# Patient Record
Sex: Female | Born: 1945
Health system: Southern US, Community
[De-identification: ages and names within clinical notes are randomized; demographics above are authoritative.]

## PROBLEM LIST (undated history)

## (undated) DIAGNOSIS — Z923 Personal history of irradiation: Secondary | ICD-10-CM

## (undated) DIAGNOSIS — K219 Gastro-esophageal reflux disease without esophagitis: Secondary | ICD-10-CM

## (undated) DIAGNOSIS — L409 Psoriasis, unspecified: Secondary | ICD-10-CM

## (undated) DIAGNOSIS — Z8719 Personal history of other diseases of the digestive system: Secondary | ICD-10-CM

## (undated) DIAGNOSIS — C50919 Malignant neoplasm of unspecified site of unspecified female breast: Secondary | ICD-10-CM

## (undated) DIAGNOSIS — M1990 Unspecified osteoarthritis, unspecified site: Secondary | ICD-10-CM

## (undated) HISTORY — PX: BREAST LUMPECTOMY: SHX2

## (undated) HISTORY — PX: COLONOSCOPY: SHX174

## (undated) HISTORY — DX: Malignant neoplasm of unspecified site of unspecified female breast: C50.919

## (undated) HISTORY — PX: APPENDECTOMY: SHX54

---

## 1997-09-22 HISTORY — PX: BREAST LUMPECTOMY: SHX2

## 1997-09-22 HISTORY — PX: BREAST SURGERY: SHX581

## 1998-06-26 ENCOUNTER — Ambulatory Visit (HOSPITAL_COMMUNITY): Admission: RE | Admit: 1998-06-26 | Discharge: 1998-06-26 | Payer: Self-pay | Admitting: Obstetrics and Gynecology

## 1998-07-03 ENCOUNTER — Ambulatory Visit (HOSPITAL_COMMUNITY): Admission: RE | Admit: 1998-07-03 | Discharge: 1998-07-03 | Payer: Self-pay | Admitting: Obstetrics and Gynecology

## 1998-07-03 ENCOUNTER — Encounter: Payer: Self-pay | Admitting: Obstetrics and Gynecology

## 1998-07-13 ENCOUNTER — Ambulatory Visit (HOSPITAL_COMMUNITY): Admission: RE | Admit: 1998-07-13 | Discharge: 1998-07-13 | Payer: Self-pay | Admitting: General Surgery

## 1998-07-13 ENCOUNTER — Encounter: Payer: Self-pay | Admitting: General Surgery

## 1998-07-13 DIAGNOSIS — Z853 Personal history of malignant neoplasm of breast: Secondary | ICD-10-CM | POA: Insufficient documentation

## 1998-07-24 ENCOUNTER — Ambulatory Visit (HOSPITAL_COMMUNITY): Admission: RE | Admit: 1998-07-24 | Discharge: 1998-07-24 | Payer: Self-pay | Admitting: General Surgery

## 1998-07-24 ENCOUNTER — Encounter: Payer: Self-pay | Admitting: General Surgery

## 1998-08-07 ENCOUNTER — Encounter: Admission: RE | Admit: 1998-08-07 | Discharge: 1998-11-05 | Payer: Self-pay | Admitting: Radiation Oncology

## 1998-08-22 ENCOUNTER — Encounter: Payer: Self-pay | Admitting: Family Medicine

## 1998-08-22 ENCOUNTER — Ambulatory Visit (HOSPITAL_COMMUNITY): Admission: RE | Admit: 1998-08-22 | Discharge: 1998-08-22 | Payer: Self-pay | Admitting: Family Medicine

## 1998-09-22 HISTORY — PX: ABDOMINAL HYSTERECTOMY: SHX81

## 1998-12-25 ENCOUNTER — Ambulatory Visit (HOSPITAL_COMMUNITY): Admission: RE | Admit: 1998-12-25 | Discharge: 1998-12-25 | Payer: Self-pay | Admitting: Oncology

## 1999-06-26 ENCOUNTER — Ambulatory Visit (HOSPITAL_COMMUNITY): Admission: RE | Admit: 1999-06-26 | Discharge: 1999-06-26 | Payer: Self-pay | Admitting: General Surgery

## 1999-06-26 ENCOUNTER — Encounter: Payer: Self-pay | Admitting: General Surgery

## 1999-07-30 ENCOUNTER — Encounter: Payer: Self-pay | Admitting: Obstetrics and Gynecology

## 1999-08-01 ENCOUNTER — Inpatient Hospital Stay (HOSPITAL_COMMUNITY): Admission: RE | Admit: 1999-08-01 | Discharge: 1999-08-03 | Payer: Self-pay | Admitting: Obstetrics and Gynecology

## 1999-08-01 ENCOUNTER — Encounter (INDEPENDENT_AMBULATORY_CARE_PROVIDER_SITE_OTHER): Payer: Self-pay | Admitting: Specialist

## 1999-12-11 ENCOUNTER — Ambulatory Visit (HOSPITAL_COMMUNITY): Admission: RE | Admit: 1999-12-11 | Discharge: 1999-12-11 | Payer: Self-pay | Admitting: Family Medicine

## 1999-12-11 ENCOUNTER — Encounter: Payer: Self-pay | Admitting: Family Medicine

## 1999-12-30 ENCOUNTER — Encounter: Admission: RE | Admit: 1999-12-30 | Discharge: 1999-12-30 | Payer: Self-pay | Admitting: General Surgery

## 1999-12-30 ENCOUNTER — Encounter: Payer: Self-pay | Admitting: General Surgery

## 2000-06-30 ENCOUNTER — Encounter: Payer: Self-pay | Admitting: General Surgery

## 2000-06-30 ENCOUNTER — Encounter: Admission: RE | Admit: 2000-06-30 | Discharge: 2000-06-30 | Payer: Self-pay | Admitting: General Surgery

## 2000-12-30 ENCOUNTER — Encounter: Payer: Self-pay | Admitting: Oncology

## 2000-12-30 ENCOUNTER — Encounter: Admission: RE | Admit: 2000-12-30 | Discharge: 2000-12-30 | Payer: Self-pay | Admitting: Oncology

## 2002-01-03 ENCOUNTER — Encounter: Payer: Self-pay | Admitting: Oncology

## 2002-01-03 ENCOUNTER — Encounter: Admission: RE | Admit: 2002-01-03 | Discharge: 2002-01-03 | Payer: Self-pay | Admitting: Oncology

## 2003-01-09 ENCOUNTER — Encounter: Payer: Self-pay | Admitting: Oncology

## 2003-01-09 ENCOUNTER — Encounter: Admission: RE | Admit: 2003-01-09 | Discharge: 2003-01-09 | Payer: Self-pay | Admitting: Oncology

## 2004-01-10 ENCOUNTER — Encounter: Admission: RE | Admit: 2004-01-10 | Discharge: 2004-01-10 | Payer: Self-pay | Admitting: Oncology

## 2004-09-22 HISTORY — PX: COLON SURGERY: SHX602

## 2005-01-20 ENCOUNTER — Encounter: Admission: RE | Admit: 2005-01-20 | Discharge: 2005-01-20 | Payer: Self-pay | Admitting: General Surgery

## 2005-01-27 ENCOUNTER — Encounter: Admission: RE | Admit: 2005-01-27 | Discharge: 2005-01-27 | Payer: Self-pay | Admitting: General Surgery

## 2005-05-16 ENCOUNTER — Ambulatory Visit: Payer: Self-pay | Admitting: Internal Medicine

## 2005-05-29 ENCOUNTER — Ambulatory Visit: Payer: Self-pay | Admitting: Internal Medicine

## 2005-05-30 ENCOUNTER — Inpatient Hospital Stay (HOSPITAL_COMMUNITY): Admission: EM | Admit: 2005-05-30 | Discharge: 2005-06-04 | Payer: Self-pay | Admitting: Emergency Medicine

## 2005-05-30 ENCOUNTER — Encounter (INDEPENDENT_AMBULATORY_CARE_PROVIDER_SITE_OTHER): Payer: Self-pay | Admitting: Specialist

## 2006-02-25 ENCOUNTER — Encounter: Admission: RE | Admit: 2006-02-25 | Discharge: 2006-02-25 | Payer: Self-pay | Admitting: General Surgery

## 2007-03-01 ENCOUNTER — Encounter: Admission: RE | Admit: 2007-03-01 | Discharge: 2007-03-01 | Payer: Self-pay | Admitting: General Surgery

## 2008-03-02 ENCOUNTER — Encounter: Admission: RE | Admit: 2008-03-02 | Discharge: 2008-03-02 | Payer: Self-pay | Admitting: Surgery

## 2009-03-05 ENCOUNTER — Encounter: Admission: RE | Admit: 2009-03-05 | Discharge: 2009-03-05 | Payer: Self-pay | Admitting: Surgery

## 2010-03-07 ENCOUNTER — Encounter: Admission: RE | Admit: 2010-03-07 | Discharge: 2010-03-07 | Payer: Self-pay | Admitting: Surgery

## 2010-04-15 ENCOUNTER — Encounter: Payer: Self-pay | Admitting: Internal Medicine

## 2010-10-22 NOTE — Letter (Signed)
Summary: Colonoscopy Letter  [facility]  [address] [place], [place] [address]   Phone: [phone]  Fax: [phone]      April 15, 2010 MRN: [id number]   [facility] [address] [place], [place]  [address]   Dear Ms. Omara,   According to your medical record, it is time for you to schedule a Colonoscopy. The American Cancer Society recommends this procedure as a method to detect early colon cancer. Patients with a family history of colon cancer, or a personal history of colon polyps or inflammatory bowel disease are at increased risk.  This letter has been generated based on the recommendations made at the time of your procedure. If you feel that in your particular situation this may no longer apply, please contact our office.  Please call our office at [phone] to schedule this appointment or to update your records at your earliest convenience.  Thank you for cooperating with [name] to provide you with the very best care possible.   Sincerely,  Wilhemina Bonito. Marina Goodell, M.D.  [facility] [phone]

## 2011-01-29 ENCOUNTER — Encounter (INDEPENDENT_AMBULATORY_CARE_PROVIDER_SITE_OTHER): Payer: Self-pay | Admitting: Surgery

## 2011-02-07 ENCOUNTER — Other Ambulatory Visit: Payer: Self-pay | Admitting: Surgery

## 2011-02-07 DIAGNOSIS — Z1231 Encounter for screening mammogram for malignant neoplasm of breast: Secondary | ICD-10-CM

## 2011-02-07 NOTE — Discharge Summary (Signed)
Cynthia Travis, Cynthia Travis                 ACCOUNT NO.:  [id number]   MEDICAL RECORD NO.:  [id number]          PATIENT TYPE:  INP   LOCATION:  1621                         FACILITY:  [facility]   PHYSICIAN:  Angelia Mould. Derrell Lolling, M.D.DATE OF BIRTH:  1945-11-18   DATE OF ADMISSION:  05/30/2005  DATE OF DISCHARGE:  06/04/2005                                 DISCHARGE SUMMARY   FINAL DIAGNOSES:  1.  Perforation (multiple) of the cecum with localized peritonitis.  2.  Chronic incarcerated ventral hernia, right lower quadrant trocar site.  3.  Status post partial mastectomy for breast cancer.  4.  Status post hysterectomy.   OPERATIONS PERFORMED:  1.  Exploratory laparotomy.  2.  Right colectomy.  3.  Repair of incarcerated ventral hernia.   DATE OF SURGERY:  May 30, 2005.   HISTORY:  This is a 65 year old white female who underwent elective  screening colonoscopy on May 29, 2005 by Dr. Yancey Flemings.  That  procedure was uneventful.  She did have progressive lower abdominal pain and  right-sided abdominal pain throughout the afternoon and evening and  developed vomiting.  She returned for evaluation on May 31, 2005, where  she was found to have a tender abdomen.  An x-ray showed a significant  pneumoperitoneum.  I was asked to see her at that point.   PHYSICAL EXAMINATION:  GENERAL:  An alert, pleasant white female in mild  distress. She was admittedly in pain but friendly.  ABDOMEN:  Exam showed that she was tender throughout the abdomen but more  significant guarding and a spasticity on the right side.  Bowel sounds were  absent.   ADMISSION DATA:  White blood cell count showed 20,300.  Abdominal x-rays did  show significant pneumoperitoneum.   HOSPITAL COURSE:  On the day of admission the patient was taken to the  operating room and underwent an exploratory laparotomy.  I found that she  had four or five small perforations of the cecum, which looked to be  temporarily sealed  over but were ischemic in nature.  There was some  localized peritonitis but there was no major fecal sludge.  There was also a  loop of small bowel that was chronically incarcerated into a right lower  quadrant trocar site, which I took down and repaired.  A right colectomy of  a limited nature was required and the surgery was uneventful.   Postoperatively the patient did fairly well.  She had an ileus for a few  days as expected.  We took her NG tube out on May 31, 2005 and her  Foley catheter was removed on June 01, 2005.  She became ambulatory.  She began having stools on the evening of June 02, 2005.  We advanced  her diet thereafter and she did well.   She was discharged on June 04, 2005.  At that time she was tolerating a  solid diet.  She was having semi-formed stool and was ambulatory.  Her  abdomen was soft with appropriate tenderness.  The wound looked good with no  cellulitis.  She was  given a prescription for Vicodin for pain and Augmentin  for three days.  She was asked to return see me in the office in five days.      Angelia Mould. Derrell Lolling, M.D.  Electronically Signed     HMI/MEDQ  D:  07/14/2005  T:  07/14/2005  Job:  [id number]   cc:   Tommy Medal, M.D.   Lenoard Aden, M.D.  Fax: [phone]

## 2011-02-07 NOTE — H&P (Signed)
[name], [name]                 ACCOUNT NO.:  [id number]   MEDICAL RECORD NO.:  [id number]          PATIENT TYPE:  EMS   LOCATION:  ED                           FACILITY:  [facility]   PHYSICIAN:  Wilhemina Bonito. Marina Goodell, M.D. [facility] OF BIRTH:  [birth year]   DATE OF ADMISSION:  05/30/2005  DATE OF DISCHARGE:                                HISTORY & PHYSICAL   CHIEF COMPLAINT:  Abdominal pain following colonoscopy.   HISTORY OF PRESENT ILLNESS:  Cynthia Travis is a pleasant, generally healthy, 65-  year-old lady who underwent screening colonoscopy yesterday.  Dr. Marina Goodell did  not remove any polyps and did see some diverticulosis.  He also noted some  stretch marks in the cecum associated with some minor hemorrhage which was  trauma induced.  He felt that these stretch marks had arisen because of  adhesions of the cecal colon that put some traction and trauma on the colon.  He did raise concern with the family that this area was at a higher increase  for colonoscopy-associated perforation.  When the patient left the [facility], she was comfortable and doing well.  However, early in the  evening at home she developed acute abdominal pain predominantly in the  lower abdomen, but it was diffuse and not focal, and then eventually it has  radiated up into the right upper quadrant where it seems to be worse today.  She had a couple of episodes of nausea and vomiting.  She stuck to a liquid  diet but did have some solid food in the form of some eggs last night and a  small amount of cereal.  Generally she has been anorexic.  She is urinating  normally.  The nausea and vomiting have not recurred.  However, in talking  with the staff at [facility], they were concerned and advised  her to come over to the emergency room for abdominal films in order to rule  out intestinal perforation.  These films are back and do show some free air  in the abdomen consistent with a bowel perforation.   White count is also up  at about 20,000.  The patient has not had any fevers or chills, and she is  not febrile here in the emergency room.  She does relate having developed  some weakness and presyncopal symptoms earlier today.  She has gotten some  Demerol here in the emergency room and feels that that has made her quite  comfortable.  At its worse, the pain was 10/10 in intensity, and she  describes it as being worse than labor pains.   ALLERGIES:  None.   CURRENT MEDICATIONS:  She does not take any medications regularly.   PAST MEDICAL HISTORY:  1.  A history of breast cancer.  She had a lumpectomy in 1999 followed by      radiation and then about a 5-year course of tamoxifen, but she has not      had this for at least 1 or 2 years.  Dr. Cyndie Chime is her oncologist.  2.  Psoriasis.  3.  A history of benign growths of the uterus and ovaries, for which she is      status post total abdominal hysterectomy.  This was sometime around the      turn of the century.   SOCIAL HISTORY:  She smokes one pack per day.  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  She and her husband have one child.  She drinks maybe a glass of wine or beer once a month at the most.   FAMILY HISTORY:  A history of diverticulitis and sudden death, probably from  MI, in her father in his late 3s.  Her mother suffered from diabetes  mellitus type 2, had an MI, had CVAs, had CHF, had diabetic retinopathy.  Her mother is deceased.  A brother has had colon cancer.  A brother and a  sister have had esophageal cancer.  The sister had undergone a hiatal hernia  repair before being diagnosed with the cancer, and she subsequently  underwent an esophagectomy.   REVIEW OF SYSTEMS:  NEUROLOGIC:  She has had some dizzy weak feeling but has  not fainted today.  PULMONARY:  No cough, but when she takes a deep breath  or does have a little bit of cough, she has abdominal pain in the right  upper  quadrant.  CARDIOVASCULAR:  No extremity edema.  No history of chest  pain or angina.  She rides a stationary bike 5 miles every morning.  MUSCULOSKELETAL:  Does not have any significant arthralgias or joint  swelling.  HEMATOLOGIC:  No unusual bleeding.  No inappropriate bruising.  DERMATOLOGIC:  No worrisome skin lesions that she has noticed.  NEUROLOGIC:  No history of strokes, TIAs, blackouts or seizures.   LABORATORY:  White blood cell count is 20.3 thousand, hemoglobin 14.5,  hematocrit 41.5.  Platelets 309,000.  MCV 87.6.  Sodium 133, potassium 3.5,  chloride 100, CO2 25, BUN 9, creatinine 0.9.  Glucose is 140.   PHYSICAL EXAMINATION:  GENERAL APPEARANCE:  The patient is a pleasant,  healthy-looking white female.  She is comfortable following her shot of  Demerol.  VITAL SIGNS:  Temperature 97.1, heart rate 108, respirations 20, blood  pressure 130/74.  HEENT:  Extraocular movements are intact.  There is no pallor.  Oropharynx:  Mucosa is moist and clear.  Dentition in good repair.  NECK:  No masses, no JVD, no bruits, no thyromegaly.  CHEST:  Clear to auscultation and percussion bilaterally.  She does splint a  little bit when she takes a deep breath.  COR:  There is regular rate and rhythm.  No murmurs, rubs or gallops.  ABDOMEN:  Soft, nondistended and quiet.  There is tenderness with rebound  bilaterally in the abdomen, more so in the lower abdomen.  No guarding, no  masses, no ecchymosis.  RECTAL, GU and BREAST:  Exams were not performed.  EXTREMITIES:  No cyanosis, clubbing or edema.  NEUROLOGIC:  The patient is pleasant.  She is alert and oriented times  three.  PSYCHIATRIC:  The patient is appropriate and making jokes and quite a nice,  humorous lady.   IMPRESSION:  1.  Perforated colon following colonoscopy.  Dr. Derrell Lolling is in seeing the      patient and is planning exploratory laparotomy later today.  2.  A history of breast cancer. 3.  Active smoker.  We will plan  to use a Nicoderm patch for her.   PLAN:  1.  Exploratory laparotomy with repair of perforated viscus and possible      colostomy later today.  2.  Continue the IV fluids, Unasyn and p.r.n. analgesics that have been      started here in the emergency room.      Jennye Moccasin, P.A. [facility]    ______________________________  Wilhemina Bonito. Marina Goodell, M.D. [facility]    SG/MEDQ  D:  05/30/2005  T:  05/30/2005  Job:  [id number]

## 2011-02-07 NOTE — Op Note (Signed)
NAMEJORDON, BOURQUIN                 ACCOUNT NO.:  [id number]   MEDICAL RECORD NO.:  [id number]          PATIENT TYPE:  INP   LOCATION:  0106                         FACILITY:  [facility]   PHYSICIAN:  Angelia Mould. Derrell Lolling, M.D.DATE OF BIRTH:  03/28/1946   DATE OF PROCEDURE:  05/30/2005  DATE OF DISCHARGE:                                 OPERATIVE REPORT   PREOPERATIVE DIAGNOSIS:  Perforated viscus.   POSTOPERATIVE DIAGNOSES:  1.  Multiple perforations of the cecum with peritonitis.  2.  Incarcerated ventral hernia   OPERATION PERFORMED:  1.  Exploratory laparotomy, right colectomy.  2.  Repair incarcerated ventral hernia (right lower quadrant trocar site).   SURGEON:  Angelia Mould. Derrell Lolling, M.D.   FIRST ASSISTANT:  Gita Kudo, M.D.   OPERATIVE INDICATIONS:  This is a 65 year old white female with a past  history of a laparoscopic assisted hysterectomy in the intermediate past.  She underwent screening colonoscopy yesterday by Dr. Yancey Flemings. She  developed lower abdominal pain last night which progressively became more  severe and more right-sided. She came to the emergency room today where she  was found to have fairly significant abdominal tenderness, a white blood  cell count 20,000 and x-rays which showed a pneumoperitoneum. I was asked to  see her. She was brought to the operating room urgently.   OPERATIVE FINDINGS:  The patient had at least four areas in the  antimesenteric wall of the cecum that looked like they had been perforated  and then had temporarily sealed over. The cecum had a dusky purplish color.  There was a foul odor and some peritonitis in the area but it was not a  diffuse peritonitis. The terminal ileum was incarcerated in a hole in the  abdominal wall in the right lower quadrant. This required fairly significant  sharp scissor dissection to free this out of this area where it was  incarcerated. The abdominal wall was repaired in that area as well. The  liver and gallbladder looked fine. The transverse colon, descending colon,  sigmoid colon, rectum and small bowel looked perfectly fine. The appendix  looked normal.   OPERATIVE TECHNIQUE:  Following the induction of general endotracheal  anesthesia, a Foley catheter was inserted. The abdomen was prepped and  draped in a sterile fashion. Midline laparotomy was performed and the  abdomen was entered and explored with the findings as described above.   To mobilize the right colon, I had to first free up the terminal ileum from  the incarcerated ventral hernia in the right lower quadrant. This took about  10 minutes because it was very deeply imbedded in the hole, but I was able  to dissect this out without perforating the small bowel. I irrigated this  area out and repaired the abdominal wall defect internally with two figure-  of-eight sutures of #1 PDS.   I then mobilized the terminal ileum and right colon and hepatic flexure by  dividing the lateral peritoneal attachments. Some of these attachments were  clamped, divided and ligated with 2-0 silk ties. We identified the duodenum  which  was not injured. I did not feel like that I could primarily repair the  cecum since it looked fairly significantly diseased. There were some acute  omental adhesions to the cecum which we took down. We transected the  terminal ileum just proximal to the area where it was incarcerating the  hernia using a GIA stapling device. We transected the right colon just below  the hepatic flexure also using a GIA stapling device. Mesenteric vessels  were isolated, clamped, divided and ligated with 2-0 silk ties. The specimen  was removed.   We then irrigated the abdomen with about 4 or 5 liters of saline. We ran the  bowel from the ligament of Treitz all the way through to the terminal ileum  and then the rest of the colon as well and found no other disease process.  We checked for bleeding and found none.   An  anastomosis was created between the terminal ileum and the hepatic  flexure of the colon using a GIA stapling device. A few points on the staple  line had some bleeding and had to be suture ligated with 3-0 silk suture  ligatures. The defect in the bowel wall was closed with a TA-60 stapling  device. A few extra sutures of 3-0 silk were placed to reinforce the staple  lines to strategic points.   We then changed our instruments and gloves. We then irrigated the abdomen  out with 3 or 4 more liters of saline and the irrigation fluid was clear.  The mesenteric defect was closed with interrupted figure-of-eight sutures of  2-0 silk. We placed Tisseel all over the anastomosis to facilitate healing  of the anastomosis. Once this had dried, we let the bowel drop back into the  abdominal cavity, returned the small bowel and omentum to their appropriate  positions. The midline fascia was closed with a running suture of #1 PDS.  The skin was closed with a few skin staples and we placed Telfa wicks in  between the staples. Clean bandages were placed and the patient taken to the  recovery room in stable condition. Estimated blood loss was about 200 mL.  Complications none. Sponge, needle and instrument counts were correct.      Angelia Mould. Derrell Lolling, M.D.  Electronically Signed     HMI/MEDQ  D:  05/30/2005  T:  05/30/2005  Job:  [id number]   cc:   Wilhemina Bonito. Marina Goodell, M.D. [facility]  [address]  [place]  [place] [address]

## 2011-03-10 ENCOUNTER — Ambulatory Visit
Admission: RE | Admit: 2011-03-10 | Discharge: 2011-03-10 | Disposition: A | Discharge status: Home / Self Care | Payer: Commercial Indemnity | Source: Ambulatory Visit | Attending: Surgery | Admitting: Surgery

## 2011-03-10 DIAGNOSIS — Z1231 Encounter for screening mammogram for malignant neoplasm of breast: Secondary | ICD-10-CM

## 2011-04-18 ENCOUNTER — Ambulatory Visit (INDEPENDENT_AMBULATORY_CARE_PROVIDER_SITE_OTHER): Payer: Commercial Indemnity | Admitting: Surgery

## 2011-04-18 ENCOUNTER — Encounter (INDEPENDENT_AMBULATORY_CARE_PROVIDER_SITE_OTHER): Payer: Self-pay | Admitting: Surgery

## 2011-04-18 VITALS — BP 116/80 | HR 60

## 2011-04-18 DIAGNOSIS — R1011 Right upper quadrant pain: Secondary | ICD-10-CM

## 2011-04-18 DIAGNOSIS — Z853 Personal history of malignant neoplasm of breast: Secondary | ICD-10-CM

## 2011-04-18 LAB — CBC WITH DIFFERENTIAL/PLATELET
Basophils Relative: 1 % (ref 0–1)
Eosinophils Relative: 1 % (ref 0–5)
HCT: 42.9 % (ref 36.0–46.0)
Lymphocytes Relative: 23 % (ref 12–46)
MCH: 29.7 pg (ref 26.0–34.0)
MCV: 89.7 fL (ref 78.0–100.0)
Monocytes Absolute: 0.6 10*3/uL (ref 0.1–1.0)
Neutro Abs: 5.8 10*3/uL (ref 1.7–7.7)
Neutrophils Relative %: 69 % (ref 43–77)
RDW: 14.2 % (ref 11.5–15.5)

## 2011-04-18 LAB — COMPREHENSIVE METABOLIC PANEL
Albumin: 4.5 g/dL (ref 3.5–5.2)
Alkaline Phosphatase: 90 U/L (ref 39–117)
CO2: 24 mEq/L (ref 19–32)
Creat: 0.73 mg/dL (ref 0.50–1.10)
Potassium: 4.1 mEq/L (ref 3.5–5.3)
Sodium: 139 mEq/L (ref 135–145)

## 2011-04-18 NOTE — Progress Notes (Signed)
04/18/2011  Cynthia Travis is a 65 y.o.female who presents for routine followup of her Right breast cancer, (DCIS)diagnosed in October ,1999 and treated with Lumpectomy/radiation. She has no problems or concerns on either side.  PFSH She has had no significant changes since the last visit here.  ROS She notes that she has been having episodes of postprandial right upper quadrant abdominal pain for the last several months. This is often followed by eating a high-fat diet. He is concerned that she may have gallstones although she has never been worked up for this in the past. She is concerned because both of her sisters have had gallbladder problems requiring emergency surgeries.  The pain seems to resolve spontaneously and she has not had a good emergency room because of these episodes. There is associated with some mild nausea.  General: The patient is alert, oriented, generally healty appearing, NAD. Mood and affect are normal.  Breasts : Status post right lumpectomy. She is still a little bit tender. Otherwise normal although there is an indention from where the lumpectomy was. No dominant mass with left breast is normal. The skin nipple or areolar changes are noted. Lymphatics: She has no axillary or supraclavicular adenopathy on either side.  Extremities: Full ROM of the surgical side with no lymphedema noted..  Abdomen: The abdomen is soft and benign. There is no tenderness. There is no hepatosplenomegaly. There are no masses. There is a well-healed surgical scar in the midline from just above the umbilicus to the symphysis. This is apparently from its liver laparotomy and right colectomy done for a colonoscopic perforation of her cecum in 2006.    Data Reviewed: Mammogram was normal  Impression: 1. Status post lumpectomy for DCIS now almost 13 years out with no evidence of recurrence 2 right upper quadrant pain of uncertain significance. It sounds suggestive of biliary tract  disease  Plan: 1. I will see her back in one year for followup of her DCIS although I told her she could just be routine followups with her primary physicians. 2. Right upper quadrant pain will need evaluation. We'll schedule CBC, CMET, and gallbladder ultrasound

## 2011-04-21 ENCOUNTER — Ambulatory Visit
Admission: RE | Admit: 2011-04-21 | Discharge: 2011-04-21 | Disposition: A | Discharge status: Home / Self Care | Payer: Commercial Indemnity | Source: Ambulatory Visit | Attending: Surgery | Admitting: Surgery

## 2011-04-21 ENCOUNTER — Encounter (INDEPENDENT_AMBULATORY_CARE_PROVIDER_SITE_OTHER): Payer: Self-pay | Admitting: Surgery

## 2011-04-21 DIAGNOSIS — R1011 Right upper quadrant pain: Secondary | ICD-10-CM

## 2011-04-22 ENCOUNTER — Telehealth (INDEPENDENT_AMBULATORY_CARE_PROVIDER_SITE_OTHER): Payer: Self-pay | Admitting: General Surgery

## 2011-04-22 NOTE — Telephone Encounter (Signed)
04/22/11 pt was called and given negative results of abdominal ultra sound per dr streck/;/ and told to follow up with pcp// edie [id number] eh

## 2012-02-06 ENCOUNTER — Other Ambulatory Visit (INDEPENDENT_AMBULATORY_CARE_PROVIDER_SITE_OTHER): Payer: Self-pay | Admitting: Surgery

## 2012-02-06 DIAGNOSIS — Z1231 Encounter for screening mammogram for malignant neoplasm of breast: Secondary | ICD-10-CM

## 2012-03-12 ENCOUNTER — Ambulatory Visit
Admission: RE | Admit: 2012-03-12 | Discharge: 2012-03-12 | Disposition: A | Discharge status: Home / Self Care | Payer: Commercial Indemnity | Source: Ambulatory Visit | Attending: Surgery | Admitting: Surgery

## 2012-03-12 DIAGNOSIS — Z1231 Encounter for screening mammogram for malignant neoplasm of breast: Secondary | ICD-10-CM

## 2012-04-20 ENCOUNTER — Ambulatory Visit (INDEPENDENT_AMBULATORY_CARE_PROVIDER_SITE_OTHER): Payer: Commercial Indemnity | Admitting: Surgery

## 2012-04-20 ENCOUNTER — Encounter (INDEPENDENT_AMBULATORY_CARE_PROVIDER_SITE_OTHER): Payer: Self-pay | Admitting: Surgery

## 2012-04-20 VITALS — BP 118/62 | HR 76 | Temp 98.6°F | Resp 16 | Ht 63.5 in | Wt 162.4 lb

## 2012-04-20 DIAGNOSIS — Z853 Personal history of malignant neoplasm of breast: Secondary | ICD-10-CM

## 2012-04-20 NOTE — Progress Notes (Signed)
04/20/2012  Cynthia Travis is a 66 y.o.female who presents for routine followup of her Right breast cancer, (DCIS)diagnosed in October ,1999 and treated with Lumpectomy/radiation. She has no problems or concerns on either side.  PFSH She has had no significant changes since the last visit here.  ROS Basically negative. Still some occasional RUQ post prandial pain. Last year sono and labs negative   General: The patient is alert, oriented, generally healty appearing, NAD. Mood and affect are normal.  Breasts : Status post right lumpectomy. She is still a little bit tender. Otherwise normal although there is an indention from where the lumpectomy was. No dominant mass with left breast is normal. The skin nipple or areolar changes are noted. Lymphatics: She has no axillary or supraclavicular adenopathy on either side.  Extremities: Full ROM of the surgical side with no lymphedema noted..   Data Reviewed: Mammogram was normal  Impression: 1. Status post lumpectomy for DCIS now almost 13 years out with no evidence of recurrence 2 [place]  Plan: 1.RTC PRN 2. Counselled re smoking cessation and written material given. Advised to f/u with Dr Wynelle Link

## 2012-04-20 NOTE — Patient Instructions (Signed)
Continue to have annual mammograms. Come to see [name] again if you develop any problems. Try to quit smoking. Smoking Cessation This document explains the best ways for you to quit smoking and new treatments to help. It lists new medicines that can double or triple your chances of quitting and quitting for good. It also considers ways to avoid relapses and concerns you may have about quitting, including weight gain. NICOTINE: A POWERFUL ADDICTION If you have tried to quit smoking, you know how hard it can be. It is hard because nicotine is a very addictive drug. For some people, it can be as addictive as heroin or cocaine. Usually, people make 2 or 3 tries, or more, before finally being able to quit. Each time you try to quit, you can learn about what helps and what hurts. Quitting takes hard work and a lot of effort, but you can quit smoking. QUITTING SMOKING IS ONE OF THE MOST IMPORTANT THINGS YOU WILL EVER DO.  You will live longer, feel better, and live better.   The impact on your body of quitting smoking is felt almost immediately:   Within 20 minutes, blood pressure decreases. Pulse returns to its normal level.   After 8 hours, carbon monoxide levels in the blood return to normal. Oxygen level increases.   After 24 hours, chance of heart attack starts to decrease. Breath, hair, and body stop smelling like smoke.   After 48 hours, damaged nerve endings begin to recover. Sense of taste and smell improve.   After 72 hours, the body is virtually free of nicotine. Bronchial tubes relax and breathing becomes easier.   After 2 to 12 weeks, lungs can hold more air. Exercise becomes easier and circulation improves.   Quitting will reduce your risk of having a heart attack, stroke, cancer, or lung disease:   After 1 year, the risk of coronary heart disease is cut in half.   After 5 years, the risk of stroke falls to the same as a nonsmoker.   After 10 years, the risk of lung cancer is cut in  half and the risk of other cancers decreases significantly.   After 15 years, the risk of coronary heart disease drops, usually to the level of a nonsmoker.   If you are pregnant, quitting smoking will improve your chances of having a healthy baby.   The people you live with, especially your children, will be healthier.   You will have extra money to spend on things other than cigarettes.  FIVE KEYS TO QUITTING Studies have shown that these 5 steps will help you quit smoking and quit for good. You have the best chances of quitting if you use them together: 1. Get ready.  2. Get support and encouragement.  3. Learn new skills and behaviors.  4. Get medicine to reduce your nicotine addiction and use it correctly.  5. Be prepared for relapse or difficult situations. Be determined to continue trying to quit, even if you do not succeed at first.  1. GET READY  Set a quit date.   Change your environment.   Get rid of ALL cigarettes, ashtrays, matches, and lighters in your home, car, and place of work.   Do not let people smoke in your home.   Review your past attempts to quit. Think about what worked and what did not.   Once you quit, do not smoke. NOT EVEN A PUFF!  2. GET SUPPORT AND ENCOURAGEMENT Studies have shown that you have  a better chance of being successful if you have help. You can get support in many ways.  Tell your family, friends, and coworkers that you are going to quit and need their support. Ask them not to smoke around you.   Talk to your caregivers (doctor, dentist, nurse, pharmacist, psychologist, and/or smoking counselor).   Get individual, group, or telephone counseling and support. The more counseling you have, the better your chances are of quitting. Programs are available at [employer] and health centers. Call your local health department for information about programs in your area.   Spiritual beliefs and practices may help some smokers quit.   Quit  meters are Photographer that keep track of quit statistics, such as amount of "quit-time," cigarettes not smoked, and money saved.   Many smokers find one or more of the many self-help books available useful in helping them quit and stay off tobacco.  3. LEARN NEW SKILLS AND BEHAVIORS  Try to distract yourself from urges to smoke. Talk to someone, go for a walk, or occupy your time with a task.   When you first try to quit, change your routine. Take a different route to work. Drink tea instead of coffee. Eat breakfast in a different place.   Do something to reduce your stress. Take a hot bath, exercise, or read a book.   Plan something enjoyable to do every day. Reward yourself for not smoking.   Explore interactive web-based programs that specialize in helping you quit.  4. GET MEDICINE AND USE IT CORRECTLY Medicines can help you stop smoking and decrease the urge to smoke. Combining medicine with the above behavioral methods and support can quadruple your chances of successfully quitting smoking. The [place] Food and Drug Administration (FDA) has approved 7 medicines to help you quit smoking. These medicines fall into 3 categories.  Nicotine replacement therapy (delivers nicotine to your body without the negative effects and risks of smoking):   Nicotine gum: Available over-the-counter.   Nicotine lozenges: Available over-the-counter.   Nicotine inhaler: Available by prescription.   Nicotine nasal spray: Available by prescription.   Nicotine skin patches (transdermal): Available by prescription and over-the-counter.   Antidepressant medicine (helps people abstain from smoking, but how this works is unknown):   Bupropion sustained-release (SR) tablets: Available by prescription.   Nicotinic receptor partial agonist (simulates the effect of nicotine in your brain):   Varenicline tartrate tablets: Available by prescription.   Ask your caregiver for  advice about which medicines to use and how to use them. Carefully read the information on the package.   Everyone who is trying to quit may benefit from using a medicine. If you are pregnant or trying to become pregnant, nursing an infant, you are under age 35, or you smoke fewer than 10 cigarettes per day, talk to your caregiver before taking any nicotine replacement medicines.   You should stop using a nicotine replacement product and call your caregiver if you experience nausea, dizziness, weakness, vomiting, fast or irregular heartbeat, mouth problems with the lozenge or gum, or redness or swelling of the skin around the patch that does not go away.   Do not use any other product containing nicotine while using a nicotine replacement product.   Talk to your caregiver before using these products if you have diabetes, heart disease, asthma, stomach ulcers, you had a recent heart attack, you have high blood pressure that is not controlled with medicine, a history of irregular  heartbeat, or you have been prescribed medicine to help you quit smoking.  5. BE PREPARED FOR RELAPSE OR DIFFICULT SITUATIONS  Most relapses occur within the first 3 months after quitting. Do not be discouraged if you start smoking again. Remember, most people try several times before they finally quit.   You may have symptoms of withdrawal because your body is used to nicotine. You may crave cigarettes, be irritable, feel very hungry, cough often, get headaches, or have difficulty concentrating.   The withdrawal symptoms are only temporary. They are strongest when you first quit, but they will go away within 10 to 14 days.  Here are some difficult situations to watch for:  Alcohol. Avoid drinking alcohol. Drinking lowers your chances of successfully quitting.   Caffeine. Try to reduce the amount of caffeine you consume. It also lowers your chances of successfully quitting.   Other smokers. Being around smoking can make  you want to smoke. Avoid smokers.   Weight gain. Many smokers will gain weight when they quit, usually less than 10 pounds. Eat a healthy diet and stay active. Do not let weight gain distract you from your main goal, quitting smoking. Some medicines that help you quit smoking may also help delay weight gain. You can always lose the weight gained after you quit.   Bad mood or depression. There are a lot of ways to improve your mood other than smoking.  If you are having problems with any of these situations, talk to your caregiver. SPECIAL SITUATIONS AND CONDITIONS Studies suggest that everyone can quit smoking. Your situation or condition can give you a special reason to quit.  Pregnant women/new mothers: By quitting, you protect your baby's health and your own.   Hospitalized patients: By quitting, you reduce health problems and help healing.   Heart attack patients: By quitting, you reduce your risk of a second heart attack.   Lung, head, and neck cancer patients: By quitting, you reduce your chance of a second cancer.   Parents of children and adolescents: By quitting, you protect your children from illnesses caused by secondhand smoke.  QUESTIONS TO THINK ABOUT Think about the following questions before you try to stop smoking. You may want to talk about your answers with your caregiver.  Why do you want to quit?   If you tried to quit in the past, what helped and what did not?   What will be the most difficult situations for you after you quit? How will you plan to handle them?   Who can help you through the tough times? Your family? Friends? Caregiver?   What pleasures do you get from smoking? What ways can you still get pleasure if you quit?  Here are some questions to ask your caregiver:  How can you help me to be successful at quitting?   What medicine do you think would be best for me and how should I take it?   What should I do if I need more help?   What is smoking  withdrawal like? How can I get information on withdrawal?  Quitting takes hard work and a lot of effort, but you can quit smoking. FOR MORE INFORMATION  Smokefree.gov (http://www.davis-sullivan.com/) provides free, accurate, evidence-based information and professional assistance to help support the immediate and long-term needs of people trying to quit smoking. Document Released: 09/02/2001 Document Revised: 08/28/2011 Document Reviewed: 06/25/2009 [facility] Patient Information 2012 [place], [place].

## 2012-10-08 DIAGNOSIS — Z23 Encounter for immunization: Secondary | ICD-10-CM | POA: Diagnosis not present

## 2013-02-07 ENCOUNTER — Other Ambulatory Visit: Payer: Self-pay

## 2013-02-07 DIAGNOSIS — Z1231 Encounter for screening mammogram for malignant neoplasm of breast: Secondary | ICD-10-CM

## 2013-03-14 ENCOUNTER — Ambulatory Visit
Admission: RE | Admit: 2013-03-14 | Discharge: 2013-03-14 | Disposition: A | Discharge status: Home / Self Care | Payer: Medicare Other | Source: Ambulatory Visit

## 2013-03-14 ENCOUNTER — Ambulatory Visit: Payer: Commercial Indemnity

## 2013-03-14 DIAGNOSIS — Z1231 Encounter for screening mammogram for malignant neoplasm of breast: Secondary | ICD-10-CM | POA: Diagnosis not present

## 2013-06-17 ENCOUNTER — Ambulatory Visit
Admission: RE | Admit: 2013-06-17 | Discharge: 2013-06-17 | Disposition: A | Discharge status: Home / Self Care | Payer: Medicare Other | Source: Ambulatory Visit | Attending: Physician Assistant | Admitting: Physician Assistant

## 2013-06-17 ENCOUNTER — Other Ambulatory Visit: Payer: Self-pay | Admitting: Physician Assistant

## 2013-06-17 DIAGNOSIS — M79609 Pain in unspecified limb: Secondary | ICD-10-CM | POA: Diagnosis not present

## 2013-06-17 DIAGNOSIS — Z1331 Encounter for screening for depression: Secondary | ICD-10-CM | POA: Diagnosis not present

## 2013-06-17 DIAGNOSIS — Z23 Encounter for immunization: Secondary | ICD-10-CM | POA: Diagnosis not present

## 2013-06-17 DIAGNOSIS — M545 Low back pain, unspecified: Secondary | ICD-10-CM | POA: Diagnosis not present

## 2013-06-17 DIAGNOSIS — M25569 Pain in unspecified knee: Secondary | ICD-10-CM | POA: Diagnosis not present

## 2013-06-17 DIAGNOSIS — M169 Osteoarthritis of hip, unspecified: Secondary | ICD-10-CM | POA: Diagnosis not present

## 2013-06-29 DIAGNOSIS — M25569 Pain in unspecified knee: Secondary | ICD-10-CM | POA: Diagnosis not present

## 2013-06-29 DIAGNOSIS — M1710 Unilateral primary osteoarthritis, unspecified knee: Secondary | ICD-10-CM | POA: Diagnosis not present

## 2013-08-03 DIAGNOSIS — M169 Osteoarthritis of hip, unspecified: Secondary | ICD-10-CM | POA: Diagnosis not present

## 2013-09-21 ENCOUNTER — Other Ambulatory Visit: Payer: Self-pay | Admitting: Orthopedic Surgery

## 2013-09-28 ENCOUNTER — Encounter (HOSPITAL_COMMUNITY): Payer: Self-pay | Admitting: Pharmacy Technician

## 2013-09-30 NOTE — Pre-Procedure Instructions (Signed)
Cynthia Travis  09/30/2013   Your procedure is scheduled on:  Monday October 10, 2013 @ 7:30 AM.  Report to [facility] Short Stay Entrance "A"  Admitting at 5:30 AM.  Call this number if you have problems the morning of surgery: [phone]   Remember:   Do not eat food or drink liquids after midnight.   Take these medicines the morning of surgery with A SIP OF WATER: NONE   Do not wear jewelry, make-up or nail polish.  Do not wear lotions, powders, or perfumes. You may wear deodorant.  Do not shave 48 hours prior to surgery.   Do not bring valuables to the hospital.  [facility] is not responsible for any belongings or valuables.               Contacts, dentures or bridgework may not be worn into surgery.  Leave suitcase in the car. After surgery it may be brought to your room.  For patients admitted to the hospital, discharge time is determined by your treatment team.               Patients discharged the day of surgery will not be allowed to drive home.  Name and phone number of your driver: Family/Friend  Special Instructions: Shower using CHG 2 nights before surgery and the night before surgery.  If you shower the day of surgery use CHG.  Use special wash - you have one bottle of CHG for all showers.  You should use approximately 1/3 of the bottle for each shower.   Please read over the following fact sheets that you were given: Pain Booklet, Coughing and Deep Breathing, Blood Transfusion Information, Total Joint Packet, MRSA Information and Surgical Site Infection Prevention

## 2013-10-03 ENCOUNTER — Encounter (HOSPITAL_COMMUNITY): Payer: Self-pay

## 2013-10-03 ENCOUNTER — Encounter (HOSPITAL_COMMUNITY)
Admission: RE | Admit: 2013-10-03 | Discharge: 2013-10-03 | Disposition: A | Discharge status: Home / Self Care | Payer: Medicare Other | Source: Ambulatory Visit | Attending: Orthopedic Surgery | Admitting: Orthopedic Surgery

## 2013-10-03 ENCOUNTER — Ambulatory Visit (HOSPITAL_COMMUNITY)
Admission: RE | Admit: 2013-10-03 | Discharge: 2013-10-03 | Disposition: A | Discharge status: Home / Self Care | Payer: Medicare Other | Source: Ambulatory Visit | Attending: Orthopedic Surgery | Admitting: Orthopedic Surgery

## 2013-10-03 DIAGNOSIS — Z01812 Encounter for preprocedural laboratory examination: Secondary | ICD-10-CM | POA: Diagnosis not present

## 2013-10-03 DIAGNOSIS — I517 Cardiomegaly: Secondary | ICD-10-CM | POA: Diagnosis not present

## 2013-10-03 DIAGNOSIS — Z01818 Encounter for other preprocedural examination: Secondary | ICD-10-CM | POA: Diagnosis not present

## 2013-10-03 DIAGNOSIS — Z01810 Encounter for preprocedural cardiovascular examination: Secondary | ICD-10-CM | POA: Diagnosis not present

## 2013-10-03 HISTORY — DX: Psoriasis, unspecified: L40.9

## 2013-10-03 HISTORY — DX: Gastro-esophageal reflux disease without esophagitis: K21.9

## 2013-10-03 HISTORY — DX: Personal history of other diseases of the digestive system: Z87.19

## 2013-10-03 HISTORY — DX: Unspecified osteoarthritis, unspecified site: M19.90

## 2013-10-03 LAB — CBC WITH DIFFERENTIAL/PLATELET
Basophils Absolute: 0.1 10*3/uL (ref 0.0–0.1)
Basophils Relative: 1 % (ref 0–1)
Eosinophils Absolute: 0.2 10*3/uL (ref 0.0–0.7)
Eosinophils Relative: 2 % (ref 0–5)
HCT: 40.9 % (ref 36.0–46.0)
HEMOGLOBIN: 14 g/dL (ref 12.0–15.0)
LYMPHS ABS: 2.5 10*3/uL (ref 0.7–4.0)
Lymphocytes Relative: 26 % (ref 12–46)
MCH: 30.5 pg (ref 26.0–34.0)
MCHC: 34.2 g/dL (ref 30.0–36.0)
MCV: 89.1 fL (ref 78.0–100.0)
Monocytes Absolute: 0.6 10*3/uL (ref 0.1–1.0)
Monocytes Relative: 6 % (ref 3–12)
NEUTROS ABS: 6.4 10*3/uL (ref 1.7–7.7)
NEUTROS PCT: 65 % (ref 43–77)
Platelets: 325 10*3/uL (ref 150–400)
RBC: 4.59 MIL/uL (ref 3.87–5.11)
RDW: 13.7 % (ref 11.5–15.5)
WBC: 9.8 10*3/uL (ref 4.0–10.5)

## 2013-10-03 LAB — URINALYSIS, ROUTINE W REFLEX MICROSCOPIC
Bilirubin Urine: NEGATIVE
Glucose, UA: NEGATIVE mg/dL
Hgb urine dipstick: NEGATIVE
KETONES UR: NEGATIVE mg/dL
LEUKOCYTES UA: NEGATIVE
Nitrite: NEGATIVE
PROTEIN: NEGATIVE mg/dL
Specific Gravity, Urine: 1.005 (ref 1.005–1.030)
UROBILINOGEN UA: 0.2 mg/dL (ref 0.0–1.0)
pH: 5 (ref 5.0–8.0)

## 2013-10-03 LAB — ABO/RH: ABO/RH(D): A POS

## 2013-10-03 LAB — BASIC METABOLIC PANEL
BUN: 13 mg/dL (ref 6–23)
CHLORIDE: 101 meq/L (ref 96–112)
CO2: 25 mEq/L (ref 19–32)
Calcium: 9.1 mg/dL (ref 8.4–10.5)
Creatinine, Ser: 0.72 mg/dL (ref 0.50–1.10)
GFR calc Af Amer: >90 mL/min (ref >90)
GFR, EST NON AFRICAN AMERICAN: 88 mL/min — AB (ref >90)
GLUCOSE: 118 mg/dL — AB (ref 70–99)
POTASSIUM: 3.9 meq/L (ref 3.7–5.3)
Sodium: 139 mEq/L (ref 137–147)

## 2013-10-03 LAB — PROTIME-INR
INR: 0.92 (ref 0.00–1.49)
Prothrombin Time: 12.2 seconds (ref 11.6–15.2)

## 2013-10-03 LAB — TYPE AND SCREEN
ABO/RH(D): A POS
Antibody Screen: NEGATIVE

## 2013-10-03 LAB — APTT: aPTT: 30 seconds (ref 24–37)

## 2013-10-03 LAB — SURGICAL PCR SCREEN
MRSA, PCR: NEGATIVE
Staphylococcus aureus: NEGATIVE

## 2013-10-06 NOTE — H&P (Signed)
TOTAL HIP ADMISSION H&P  Patient is admitted for left total hip arthroplasty.  Subjective:  Chief Complaint: left hip pain  HPI: Cynthia Travis, 68 y.o. female, has a history of pain and functional disability in the left hip(s) due to arthritis and patient has failed non-surgical conservative treatments for greater than 12 weeks to include NSAID's and/or analgesics and activity modification.  Onset of symptoms was gradual starting 2 years ago with gradually worsening course since that time.The patient noted no past surgery on the left hip(s).  Patient currently rates pain in the left hip at 10 out of 10 with activity. Patient has worsening of pain with activity and weight bearing, pain that interfers with activities of daily living and pain with passive range of motion. Patient has evidence of periarticular osteophytes and joint space narrowing by imaging studies. This condition presents safety issues increasing the risk of falls.  There is no current active infection.  Patient Active Problem List   Diagnosis Date Noted  . RUQ abdominal pain 04/18/2011  . DCIS R Breast 07/13/1998   Past Medical History  Diagnosis Date  . Breast cancer   . H/O hiatal hernia   . GERD (gastroesophageal reflux disease)   . Fecal urgency     at times occured after colon surgery  . Arthritis   . Psoriasis     hands, knuckles, & belly button    Past Surgical History  Procedure Laterality Date  . Abdominal hysterectomy  2000  . Colon surgery  2006    emergency  . Breast surgery Right 1999    Partial mastectomy  . Mastectomy Right 1999    Partial  . Colonoscopy    . Appendectomy      No prescriptions prior to admission   No Known Allergies  History  Substance Use Topics  . Smoking status: Current Every Day Smoker -- 1.00 packs/day for 44 years    Types: Cigarettes  . Smokeless tobacco: Never Used  . Alcohol Use: 0.5 oz/week    1 drink(s) per week     Comment: occasionally    Family History   Problem Relation Age of Onset  . Diabetes Mother   . Heart disease Mother   . Heart disease Father   . Hypertension Father   . Stroke Father   . Diabetes Sister   . Cancer Brother     lung & colon  . Heart disease Brother   . Cancer Brother     esophageal  . Cancer Sister     esophageal     Review of Systems  Constitutional: Negative.   HENT: Negative.   Eyes: Negative.   Respiratory: Negative.   Cardiovascular: Negative.   Gastrointestinal: Negative.   Genitourinary: Negative.   Musculoskeletal: Positive for joint pain.  Skin: Negative.   Neurological: Negative.   Endo/Heme/Allergies: Negative.   Psychiatric/Behavioral: Negative.     Objective:  Physical Exam  Constitutional: She is oriented to person, place, and time. She appears well-developed and well-nourished.  HENT:  Head: Normocephalic and atraumatic.  Eyes: Pupils are equal, round, and reactive to light.  Neck: Normal range of motion. Neck supple.  Cardiovascular: Intact distal pulses.   Respiratory: Effort normal.  GI: Soft.  Musculoskeletal:  Patient's right hip has good strength good range of motion and no pain.  Patient's left hip also has good strength.  She does have reduced internal rotation to approximately 5.  She has increased pain with this motion.  No pain with  palpation of the lateral hip but mild groin pain.  She has brisk capillary refill and is neurovascularly intact distally.  Neurological: She is alert and oriented to person, place, and time.  Skin: Skin is warm and dry.  Psychiatric: She has a normal mood and affect. Her behavior is normal. Judgment and thought content normal.    Vital signs in last 24 hours:    Labs:   Estimated body mass index is 28.31 kg/(m^2) as calculated from the following:   Height as of 04/20/12: 5' 3.5" (1.613 m).   Weight as of 04/20/12: 73.664 kg (162 lb 6.4 oz).   Imaging Review X-rays: Multiple views of the left hip are once again reviewed in  office today.  No fracture dislocation subluxation or tumors identified.  She does have moderate arthritis with a drop osteophyte.  Assessment/Plan:  End stage arthritis, left hip(s)  The patient history, physical examination, clinical judgement of the provider and imaging studies are consistent with end stage degenerative joint disease of the left hip(s) and total hip arthroplasty is deemed medically necessary. The treatment options including medical management, injection therapy, arthroscopy and arthroplasty were discussed at length. The risks and benefits of total hip arthroplasty were presented and reviewed. The risks due to aseptic loosening, infection, stiffness, dislocation/subluxation,  thromboembolic complications and other imponderables were discussed.  The patient acknowledged the explanation, agreed to proceed with the plan and consent was signed. Patient is being admitted for inpatient treatment for surgery, pain control, PT, OT, prophylactic antibiotics, VTE prophylaxis, progressive ambulation and ADL's and discharge planning.The patient is planning to be discharged to skilled nursing facility

## 2013-10-09 MED ORDER — CHLORHEXIDINE GLUCONATE 4 % EX LIQD: 60.0000 mL | Freq: Once | CUTANEOUS | Status: DC

## 2013-10-09 MED ORDER — CEFAZOLIN SODIUM-DEXTROSE 2-3 GM-% IV SOLR
2.0000 g | INTRAVENOUS | Status: AC
  Administered 2013-10-10: 2 g via INTRAVENOUS
  Filled 2013-10-09: qty 50

## 2013-10-10 ENCOUNTER — Inpatient Hospital Stay (HOSPITAL_COMMUNITY): Payer: Medicare Other

## 2013-10-10 ENCOUNTER — Encounter (HOSPITAL_COMMUNITY): Payer: Medicare Other | Admitting: Anesthesiology

## 2013-10-10 ENCOUNTER — Encounter (HOSPITAL_COMMUNITY): Payer: Self-pay | Admitting: *Deleted

## 2013-10-10 ENCOUNTER — Inpatient Hospital Stay (HOSPITAL_COMMUNITY): Payer: Medicare Other | Admitting: Anesthesiology

## 2013-10-10 ENCOUNTER — Inpatient Hospital Stay (HOSPITAL_COMMUNITY)
Admission: RE | Admit: 2013-10-10 | Discharge: 2013-10-12 | DRG: 470 | Disposition: A | Discharge status: Home Health | Payer: Medicare Other | Source: Ambulatory Visit | Attending: Orthopedic Surgery | Admitting: Orthopedic Surgery

## 2013-10-10 ENCOUNTER — Encounter (HOSPITAL_COMMUNITY)
Admission: RE | Disposition: A | Discharge status: Home Health | Payer: Self-pay | Source: Ambulatory Visit | Attending: Orthopedic Surgery

## 2013-10-10 DIAGNOSIS — Z7982 Long term (current) use of aspirin: Secondary | ICD-10-CM

## 2013-10-10 DIAGNOSIS — F17200 Nicotine dependence, unspecified, uncomplicated: Secondary | ICD-10-CM | POA: Diagnosis present

## 2013-10-10 DIAGNOSIS — Z8249 Family history of ischemic heart disease and other diseases of the circulatory system: Secondary | ICD-10-CM | POA: Diagnosis not present

## 2013-10-10 DIAGNOSIS — Z833 Family history of diabetes mellitus: Secondary | ICD-10-CM | POA: Diagnosis not present

## 2013-10-10 DIAGNOSIS — Z79899 Other long term (current) drug therapy: Secondary | ICD-10-CM

## 2013-10-10 DIAGNOSIS — Z9089 Acquired absence of other organs: Secondary | ICD-10-CM | POA: Diagnosis not present

## 2013-10-10 DIAGNOSIS — Z9010 Acquired absence of unspecified breast and nipple: Secondary | ICD-10-CM | POA: Diagnosis not present

## 2013-10-10 DIAGNOSIS — K219 Gastro-esophageal reflux disease without esophagitis: Secondary | ICD-10-CM | POA: Diagnosis not present

## 2013-10-10 DIAGNOSIS — M1610 Unilateral primary osteoarthritis, unspecified hip: Principal | ICD-10-CM | POA: Diagnosis present

## 2013-10-10 DIAGNOSIS — M169 Osteoarthritis of hip, unspecified: Secondary | ICD-10-CM | POA: Diagnosis not present

## 2013-10-10 DIAGNOSIS — L408 Other psoriasis: Secondary | ICD-10-CM | POA: Diagnosis not present

## 2013-10-10 DIAGNOSIS — M25559 Pain in unspecified hip: Secondary | ICD-10-CM | POA: Diagnosis not present

## 2013-10-10 DIAGNOSIS — Z823 Family history of stroke: Secondary | ICD-10-CM

## 2013-10-10 DIAGNOSIS — Z853 Personal history of malignant neoplasm of breast: Secondary | ICD-10-CM | POA: Diagnosis not present

## 2013-10-10 DIAGNOSIS — C50919 Malignant neoplasm of unspecified site of unspecified female breast: Secondary | ICD-10-CM | POA: Diagnosis not present

## 2013-10-10 HISTORY — PX: TOTAL HIP ARTHROPLASTY: SHX124

## 2013-10-10 LAB — GLUCOSE, CAPILLARY: Glucose-Capillary: 104 mg/dL — ABNORMAL HIGH (ref 70–99)

## 2013-10-10 SURGERY — ARTHROPLASTY, HIP, TOTAL,POSTERIOR APPROACH
Anesthesia: General | Site: Hip | Laterality: Left

## 2013-10-10 MED ORDER — MENTHOL 3 MG MT LOZG: 1.0000 | LOZENGE | OROMUCOSAL | Status: DC | PRN

## 2013-10-10 MED ORDER — ALUMINUM HYDROXIDE GEL 320 MG/5ML PO SUSP
15.0000 mL | ORAL | Status: DC | PRN
  Filled 2013-10-10: qty 30

## 2013-10-10 MED ORDER — METHOCARBAMOL 500 MG PO TABS
500.0000 mg | ORAL_TABLET | Freq: Four times a day (QID) | ORAL | Status: DC | PRN
  Administered 2013-10-10 – 2013-10-11 (×3): 500 mg via ORAL
  Filled 2013-10-10 (×2): qty 1

## 2013-10-10 MED ORDER — HYDROMORPHONE HCL PF 1 MG/ML IJ SOLN
INTRAMUSCULAR | Status: AC
  Filled 2013-10-10: qty 1

## 2013-10-10 MED ORDER — ALBUMIN HUMAN 5 % IV SOLN
INTRAVENOUS | Status: DC | PRN
  Administered 2013-10-10: 09:00:00 via INTRAVENOUS

## 2013-10-10 MED ORDER — PROPOFOL 10 MG/ML IV BOLUS
INTRAVENOUS | Status: DC | PRN
  Administered 2013-10-10: 170 mg via INTRAVENOUS

## 2013-10-10 MED ORDER — GLYCOPYRROLATE 0.2 MG/ML IJ SOLN
INTRAMUSCULAR | Status: DC | PRN
  Administered 2013-10-10: 0.6 mg via INTRAVENOUS

## 2013-10-10 MED ORDER — PHENYLEPHRINE HCL 10 MG/ML IJ SOLN
INTRAMUSCULAR | Status: DC | PRN
  Administered 2013-10-10 (×3): 80 ug via INTRAVENOUS
  Administered 2013-10-10 (×2): 40 ug via INTRAVENOUS

## 2013-10-10 MED ORDER — DOCUSATE SODIUM 100 MG PO CAPS
100.0000 mg | ORAL_CAPSULE | Freq: Two times a day (BID) | ORAL | Status: DC
  Administered 2013-10-10 – 2013-10-11 (×2): 100 mg via ORAL
  Filled 2013-10-10 (×6): qty 1

## 2013-10-10 MED ORDER — ASPIRIN EC 325 MG PO TBEC: 325.0000 mg | DELAYED_RELEASE_TABLET | Freq: Two times a day (BID) | ORAL | Status: AC

## 2013-10-10 MED ORDER — OXYCODONE HCL 5 MG PO TABS
ORAL_TABLET | ORAL | Status: AC
  Filled 2013-10-10: qty 3

## 2013-10-10 MED ORDER — MIDAZOLAM HCL 5 MG/5ML IJ SOLN
INTRAMUSCULAR | Status: DC | PRN
Start: 2013-10-10 — End: 2013-10-10
  Administered 2013-10-10: 2 mg via INTRAVENOUS

## 2013-10-10 MED ORDER — DIPHENHYDRAMINE HCL 12.5 MG/5ML PO ELIX
12.5000 mg | ORAL_SOLUTION | ORAL | Status: DC | PRN
  Filled 2013-10-10: qty 10

## 2013-10-10 MED ORDER — ROCURONIUM BROMIDE 100 MG/10ML IV SOLN
INTRAVENOUS | Status: DC | PRN
  Administered 2013-10-10: 50 mg via INTRAVENOUS

## 2013-10-10 MED ORDER — FENTANYL CITRATE 0.05 MG/ML IJ SOLN
INTRAMUSCULAR | Status: DC | PRN
  Administered 2013-10-10 (×2): 50 ug via INTRAVENOUS
  Administered 2013-10-10: 100 ug via INTRAVENOUS
  Administered 2013-10-10 (×2): 50 ug via INTRAVENOUS

## 2013-10-10 MED ORDER — DEXTROSE-NACL 5-0.45 % IV SOLN: INTRAVENOUS | Status: DC

## 2013-10-10 MED ORDER — ONDANSETRON HCL 4 MG/2ML IJ SOLN
INTRAMUSCULAR | Status: DC | PRN
  Administered 2013-10-10: 4 mg via INTRAVENOUS

## 2013-10-10 MED ORDER — MIDAZOLAM HCL 2 MG/2ML IJ SOLN: 1.0000 mg | INTRAMUSCULAR | Status: DC | PRN

## 2013-10-10 MED ORDER — BISACODYL 5 MG PO TBEC: 5.0000 mg | DELAYED_RELEASE_TABLET | Freq: Every day | ORAL | Status: DC | PRN

## 2013-10-10 MED ORDER — SODIUM CHLORIDE 0.9 % IR SOLN
Status: DC | PRN
  Administered 2013-10-10: 1000 mL

## 2013-10-10 MED ORDER — HYDROMORPHONE HCL PF 1 MG/ML IJ SOLN
1.0000 mg | INTRAMUSCULAR | Status: DC | PRN
  Administered 2013-10-10: 1 mg via INTRAVENOUS
  Filled 2013-10-10: qty 1

## 2013-10-10 MED ORDER — ONDANSETRON HCL 4 MG PO TABS: 4.0000 mg | ORAL_TABLET | Freq: Four times a day (QID) | ORAL | Status: DC | PRN

## 2013-10-10 MED ORDER — HYDROMORPHONE HCL PF 1 MG/ML IJ SOLN
0.2500 mg | INTRAMUSCULAR | Status: DC | PRN
  Administered 2013-10-10 (×4): 0.5 mg via INTRAVENOUS

## 2013-10-10 MED ORDER — METHOCARBAMOL 100 MG/ML IJ SOLN: 500.0000 mg | Freq: Four times a day (QID) | INTRAVENOUS | Status: DC | PRN

## 2013-10-10 MED ORDER — BUPIVACAINE-EPINEPHRINE 0.5% -1:200000 IJ SOLN
INTRAMUSCULAR | Status: DC | PRN
  Administered 2013-10-10: 10 mL

## 2013-10-10 MED ORDER — PHENOL 1.4 % MT LIQD: 1.0000 | OROMUCOSAL | Status: DC | PRN

## 2013-10-10 MED ORDER — NEOSTIGMINE METHYLSULFATE 1 MG/ML IJ SOLN
INTRAMUSCULAR | Status: DC | PRN
  Administered 2013-10-10: 4 mg via INTRAVENOUS

## 2013-10-10 MED ORDER — LIDOCAINE HCL (CARDIAC) 20 MG/ML IV SOLN
INTRAVENOUS | Status: DC | PRN
  Administered 2013-10-10: 90 mg via INTRAVENOUS

## 2013-10-10 MED ORDER — NICOTINE 14 MG/24HR TD PT24
14.0000 mg | MEDICATED_PATCH | Freq: Every day | TRANSDERMAL | Status: DC
  Administered 2013-10-10 – 2013-10-11 (×2): 14 mg via TRANSDERMAL
  Filled 2013-10-10 (×3): qty 1

## 2013-10-10 MED ORDER — METHOCARBAMOL 500 MG PO TABS: 500.0000 mg | ORAL_TABLET | Freq: Two times a day (BID) | ORAL | Status: DC

## 2013-10-10 MED ORDER — ARTIFICIAL TEARS OP OINT
TOPICAL_OINTMENT | OPHTHALMIC | Status: DC | PRN
  Administered 2013-10-10: 1 via OPHTHALMIC

## 2013-10-10 MED ORDER — BUPIVACAINE-EPINEPHRINE (PF) 0.5% -1:200000 IJ SOLN
INTRAMUSCULAR | Status: AC
  Filled 2013-10-10: qty 10

## 2013-10-10 MED ORDER — PNEUMOCOCCAL VAC POLYVALENT 25 MCG/0.5ML IJ INJ
0.5000 mL | INJECTION | INTRAMUSCULAR | Status: AC
  Administered 2013-10-11: 0.5 mL via INTRAMUSCULAR
  Filled 2013-10-10: qty 0.5

## 2013-10-10 MED ORDER — LACTATED RINGERS IV SOLN
INTRAVENOUS | Status: DC | PRN
  Administered 2013-10-10 (×2): via INTRAVENOUS

## 2013-10-10 MED ORDER — ACETAMINOPHEN 650 MG RE SUPP: 650.0000 mg | Freq: Four times a day (QID) | RECTAL | Status: DC | PRN

## 2013-10-10 MED ORDER — METHOCARBAMOL 500 MG PO TABS
ORAL_TABLET | ORAL | Status: AC
  Filled 2013-10-10: qty 1

## 2013-10-10 MED ORDER — SENNOSIDES-DOCUSATE SODIUM 8.6-50 MG PO TABS: 1.0000 | ORAL_TABLET | Freq: Every evening | ORAL | Status: DC | PRN

## 2013-10-10 MED ORDER — TRANEXAMIC ACID 100 MG/ML IV SOLN
1000.0000 mg | INTRAVENOUS | Status: AC
  Administered 2013-10-10: 1000 mg via INTRAVENOUS
  Filled 2013-10-10 (×2): qty 10

## 2013-10-10 MED ORDER — ACETAMINOPHEN 325 MG PO TABS: 650.0000 mg | ORAL_TABLET | Freq: Four times a day (QID) | ORAL | Status: DC | PRN

## 2013-10-10 MED ORDER — MAGNESIUM CITRATE PO SOLN: 1.0000 | Freq: Once | ORAL | Status: AC | PRN

## 2013-10-10 MED ORDER — OXYCODONE HCL 5 MG/5ML PO SOLN: 5.0000 mg | Freq: Once | ORAL | Status: AC | PRN

## 2013-10-10 MED ORDER — OXYCODONE HCL 5 MG PO TABS
5.0000 mg | ORAL_TABLET | Freq: Once | ORAL | Status: AC | PRN
  Administered 2013-10-10: 5 mg via ORAL

## 2013-10-10 MED ORDER — FENTANYL CITRATE 0.05 MG/ML IJ SOLN: 50.0000 ug | Freq: Once | INTRAMUSCULAR | Status: DC

## 2013-10-10 MED ORDER — METOCLOPRAMIDE HCL 10 MG PO TABS: 5.0000 mg | ORAL_TABLET | Freq: Three times a day (TID) | ORAL | Status: DC | PRN

## 2013-10-10 MED ORDER — ZOLPIDEM TARTRATE 5 MG PO TABS: 5.0000 mg | ORAL_TABLET | Freq: Every evening | ORAL | Status: DC | PRN

## 2013-10-10 MED ORDER — ASPIRIN EC 325 MG PO TBEC
325.0000 mg | DELAYED_RELEASE_TABLET | Freq: Every day | ORAL | Status: DC
  Administered 2013-10-11 – 2013-10-12 (×2): 325 mg via ORAL
  Filled 2013-10-10 (×3): qty 1

## 2013-10-10 MED ORDER — METOCLOPRAMIDE HCL 5 MG/ML IJ SOLN: 5.0000 mg | Freq: Three times a day (TID) | INTRAMUSCULAR | Status: DC | PRN

## 2013-10-10 MED ORDER — EPHEDRINE SULFATE 50 MG/ML IJ SOLN
INTRAMUSCULAR | Status: DC | PRN
  Administered 2013-10-10 (×3): 5 mg via INTRAVENOUS

## 2013-10-10 MED ORDER — OXYCODONE HCL 5 MG PO TABS
5.0000 mg | ORAL_TABLET | ORAL | Status: DC | PRN
  Administered 2013-10-10: 10 mg via ORAL
  Administered 2013-10-11 (×2): 5 mg via ORAL
  Administered 2013-10-11 – 2013-10-12 (×2): 10 mg via ORAL
  Filled 2013-10-10 (×2): qty 2
  Filled 2013-10-10 (×2): qty 1

## 2013-10-10 MED ORDER — PROMETHAZINE HCL 25 MG/ML IJ SOLN: 6.2500 mg | INTRAMUSCULAR | Status: DC | PRN

## 2013-10-10 MED ORDER — KETOROLAC TROMETHAMINE 30 MG/ML IJ SOLN
INTRAMUSCULAR | Status: AC
  Filled 2013-10-10: qty 1

## 2013-10-10 MED ORDER — OXYCODONE-ACETAMINOPHEN 5-325 MG PO TABS: 1.0000 | ORAL_TABLET | ORAL | Status: DC | PRN

## 2013-10-10 MED ORDER — KCL IN DEXTROSE-NACL 20-5-0.45 MEQ/L-%-% IV SOLN
INTRAVENOUS | Status: DC
  Administered 2013-10-10: 125 mL/h via INTRAVENOUS
  Filled 2013-10-10 (×8): qty 1000

## 2013-10-10 MED ORDER — ONDANSETRON HCL 4 MG/2ML IJ SOLN
4.0000 mg | Freq: Four times a day (QID) | INTRAMUSCULAR | Status: DC | PRN
  Administered 2013-10-11: 4 mg via INTRAVENOUS
  Filled 2013-10-10: qty 2

## 2013-10-10 SURGICAL SUPPLY — 51 items
BLADE SAW SGTL 18X1.27X75 (BLADE) ×2 IMPLANT
BRUSH FEMORAL CANAL (MISCELLANEOUS) IMPLANT
CAPT HIP PF COP ×2 IMPLANT
CLOTH BEACON ORANGE TIMEOUT ST (SAFETY) ×2 IMPLANT
COVER BACK TABLE 24X17X13 BIG (DRAPES) IMPLANT
COVER SURGICAL LIGHT HANDLE (MISCELLANEOUS) ×4 IMPLANT
DRAPE ORTHO SPLIT 77X108 STRL (DRAPES) ×1
DRAPE PROXIMA HALF (DRAPES) ×2 IMPLANT
DRAPE SURG ORHT 6 SPLT 77X108 (DRAPES) ×1 IMPLANT
DRAPE U-SHAPE 47X51 STRL (DRAPES) ×2 IMPLANT
DRILL BIT 7/64X5 (BIT) ×2 IMPLANT
DRSG AQUACEL AG ADV 3.5X10 (GAUZE/BANDAGES/DRESSINGS) ×2 IMPLANT
DURAPREP 26ML APPLICATOR (WOUND CARE) ×2 IMPLANT
ELECT BLADE 4.0 EZ CLEAN MEGAD (MISCELLANEOUS) ×2
ELECT REM PT RETURN 9FT ADLT (ELECTROSURGICAL) ×2
ELECTRODE BLDE 4.0 EZ CLN MEGD (MISCELLANEOUS) ×1 IMPLANT
ELECTRODE REM PT RTRN 9FT ADLT (ELECTROSURGICAL) ×1 IMPLANT
GAUZE XEROFORM 1X8 LF (GAUZE/BANDAGES/DRESSINGS) IMPLANT
GLOVE BIO SURGEON STRL SZ7.5 (GLOVE) ×2 IMPLANT
GLOVE BIO SURGEON STRL SZ8.5 (GLOVE) ×4 IMPLANT
GLOVE BIOGEL PI IND STRL 8 (GLOVE) ×2 IMPLANT
GLOVE BIOGEL PI IND STRL 9 (GLOVE) ×1 IMPLANT
GLOVE BIOGEL PI INDICATOR 8 (GLOVE) ×2
GLOVE BIOGEL PI INDICATOR 9 (GLOVE) ×1
GOWN PREVENTION PLUS XLARGE (GOWN DISPOSABLE) ×2 IMPLANT
GOWN STRL NON-REIN LRG LVL3 (GOWN DISPOSABLE) ×4 IMPLANT
GOWN STRL REIN XL XLG (GOWN DISPOSABLE) ×4 IMPLANT
HANDPIECE INTERPULSE COAX TIP (DISPOSABLE)
HOOD PEEL AWAY FACE SHEILD DIS (HOOD) ×4 IMPLANT
KIT BASIN OR (CUSTOM PROCEDURE TRAY) ×2 IMPLANT
KIT ROOM TURNOVER OR (KITS) ×2 IMPLANT
MANIFOLD NEPTUNE II (INSTRUMENTS) ×2 IMPLANT
NEEDLE 22X1 1/2 (OR ONLY) (NEEDLE) ×2 IMPLANT
NS IRRIG 1000ML POUR BTL (IV SOLUTION) ×2 IMPLANT
PACK TOTAL JOINT (CUSTOM PROCEDURE TRAY) ×2 IMPLANT
PAD ARMBOARD 7.5X6 YLW CONV (MISCELLANEOUS) ×4 IMPLANT
PASSER SUT SWANSON 36MM LOOP (INSTRUMENTS) ×2 IMPLANT
PRESSURIZER FEMORAL UNIV (MISCELLANEOUS) IMPLANT
SET HNDPC FAN SPRY TIP SCT (DISPOSABLE) IMPLANT
SUT ETHIBOND 2 V 37 (SUTURE) ×2 IMPLANT
SUT ETHILON 3 0 FSL (SUTURE) ×2 IMPLANT
SUT VIC AB 0 CTB1 27 (SUTURE) ×2 IMPLANT
SUT VIC AB 1 CTX 36 (SUTURE) ×1
SUT VIC AB 1 CTX36XBRD ANBCTR (SUTURE) ×1 IMPLANT
SUT VIC AB 2-0 CTB1 (SUTURE) ×2 IMPLANT
SYR CONTROL 10ML LL (SYRINGE) ×2 IMPLANT
TOWEL OR 17X24 6PK STRL BLUE (TOWEL DISPOSABLE) ×2 IMPLANT
TOWEL OR 17X26 10 PK STRL BLUE (TOWEL DISPOSABLE) ×2 IMPLANT
TOWER CARTRIDGE SMART MIX (DISPOSABLE) IMPLANT
TRAY FOLEY CATH 14FR (SET/KITS/TRAYS/PACK) ×2 IMPLANT
WATER STERILE IRR 1000ML POUR (IV SOLUTION) ×8 IMPLANT

## 2013-10-10 NOTE — Interval H&P Note (Signed)
History and Physical Interval Note:  10/10/2013 7:16 AM  Cynthia Travis  has presented today for surgery, with the diagnosis of LEFT HIP OSTEOARTHRITIS  The various methods of treatment have been discussed with the patient and family. After consideration of risks, benefits and other options for treatment, the patient has consented to  Procedure(s): TOTAL HIP ARTHROPLASTY (Left) as a surgical intervention .  The patient's history has been reviewed, patient examined, no change in status, stable for surgery.  I have reviewed the patient's chart and labs.  Questions were answered to the patient's satisfaction.     Kerin Salen

## 2013-10-10 NOTE — Transfer of Care (Signed)
Immediate Anesthesia Transfer of Care Note  Patient: Cynthia Travis  Procedure(s) Performed: Procedure(s): TOTAL HIP ARTHROPLASTY (Left)  Patient Location: PACU  Anesthesia Type:General  Level of Consciousness: awake, alert  and oriented  Airway & Oxygen Therapy: Patient Spontanous Breathing and Patient connected to nasal cannula oxygen  Post-op Assessment: Report given to PACU RN, Post -op Vital signs reviewed and stable and Patient moving all extremities X 4  Post vital signs: Reviewed and stable  Complications: No apparent anesthesia complications

## 2013-10-10 NOTE — Op Note (Signed)
OPERATIVE REPORT    DATE OF PROCEDURE:  10/10/2013       PREOPERATIVE DIAGNOSIS:  LEFT HIP OSTEOARTHRITIS                                                          POSTOPERATIVE DIAGNOSIS:  left hip osteoarthritis                                                           PROCEDURE:  L total hip arthroplasty using a 52 mm DePuy Pinnacle  Cup, [employer], 10-degree polyethylene liner index superior  and posterior, a +0 36 mm ceramic head, a [phone] SROM stem, 20FL Sleeve   SURGEON: Amaury Kuzel J    ASSISTANT:   [employer] K[employer]  (present throughout entire procedure and necessary for timely completion of the procedure)   ANESTHESIA: General BLOOD LOSS: 400 FLUID REPLACEMENT: 1800 crystalloid DRAINS: Foley Catheter URINE OUTPUT: 657QI COMPLICATIONS: none    INDICATIONS FOR PROCEDURE: A 68 y.o. year-old With  LEFT HIP OSTEOARTHRITIS   for 2 years, x-rays show bone-on-bone arthritic changes. Despite conservative measures with observation, anti-inflammatory medicine, narcotics, use of a cane, has severe unremitting pain and can ambulate only a few blocks before resting.  Patient desires elective L total hip arthroplasty to decrease pain and increase function. The risks, benefits, and alternatives were discussed at length including but not limited to the risks of infection, bleeding, nerve injury, stiffness, blood clots, the need for revision surgery, cardiopulmonary complications, among others, and they were willing to proceed. Questions answered     PROCEDURE IN DETAIL: The patient was identified by armband,  received preoperative IV antibiotics in the holding area at [facility], taken to the operating room , appropriate anesthetic monitors  were attached and general endotracheal anesthesia induced. Foley catheter was inserted. Pt was rolled into the R lateral decubitus position and fixed there with a Stulberg Mark II pelvic clamp.  The L lower extremity was then  prepped and draped  in the usual sterile fashion from the ankle to the hemipelvis. A time-out  procedure was performed. The skin along the lateral hip and thigh  infiltrated with 10 mL of 0.5% Marcaine and epinephrine solution. We  then made a posterolateral approach to the hip. With a #10 blade, a 16 cm  incision was made through the skin and subcutaneous tissue down to the level of the  IT band. Small bleeders were identified and cauterized. The IT band was cut in  line with skin incision exposing the greater trochanter. A Cobra retractor was placed between the gluteus minimus and the superior hip joint capsule, and a spiked Cobra between the quadratus femoris and the inferior hip joint capsule. This isolated the short  external rotators and piriformis tendons. These were tagged with a #2 Ethibond  suture and cut off their insertion on the intertrochanteric crest. The posterior  capsule was then developed into an acetabular-based flap from Posterior Superior off of the acetabulum out over the femoral neck and back posterior inferior to the acetabular rim. This flap was tagged with two #2 Ethibond  sutures and retracted protecting the sciatic nerve. This exposed the arthritic femoral head and osteophytes. The hip was then flexed and internally rotated, dislocating the femoral head and a standard neck cut performed 1 fingerbreadth above the lesser trochanter.  A spiked Cobra was placed in the cotyloid notch and a Hohmann retractor was then used to lever the femur anteriorly off of the anterior pelvic column. A posterior-inferior wing retractor was placed at the junction of the acetabulum and the ischium completing the acetabular exposure.We then removed the peripheral osteophytes and labrum from the acetabulum. We then reamed the acetabulum up to 51 mm with basket reamers obtaining good coverage in all quadrants. We then irrigated with normal  saline solution and hammered into place a 52 mm pinnacle cup  in 45  degrees of abduction and about 20 degrees of anteversion. More  peripheral osteophytes removed and a trial 10-degree liner placed with the  index superior-posterior. The hip was then flexed and internally rotated exposing the  proximal femur, which was entered with the initiating reamer followed by  the axial reamers up to a 15.5 mm full depth and 50mm partial depth. We then conically reamed to 41F to the correct depth for a 42 base neck. The calcar was milled to 41FL. A trial cone and stem was inserted in the 25 degrees anteversion, with a +0 57mm trial head. Trial reduction was then performed and excellent stability was noted with at 90 of flexion with 70 of internal rotation and then full extension with maximal external rotation. The hip could not be dislocated in full extension. The knee could easily flex  to about 130 degrees. We also stretched the abductors at this point,  because of the preexisting adductor contractures. All trial components  were then removed. The acetabulum was irrigated out with normal saline  solution. A titanium Apex [facility] was then screwed into place  followed by a 10-degree polyethylene liner index superior-posterior. On  the femoral side a 20FL ZTT1 sleeve was hammered into place, followed by a [id number] SROM stem in 25 degrees of anteversion. At this point, a +0 36 mm ceramic head was  hammered on the stem. The hip was reduced. We checked our stability  one more time and found it to be excellent. The wound was once again  thoroughly irrigated out with normal saline solution pulse lavage. The  capsular flap and short external rotators were repaired back to the  intertrochanteric crest through drill holes with a #2 Ethibond suture.  The IT band was closed with running 1 Vicryl suture. The subcutaneous  tissue with 0 and 2-0 undyed Vicryl suture and the skin with running  interlocking 3-0 nylon suture. Dressing of Xeroform and Mepilex was  then  applied. The patient was then unclamped, rolled supine, awaken extubated and taken to recovery room without difficulty in stable condition.   Nicky Milhouse J 10/10/2013, 8:44 AM

## 2013-10-10 NOTE — Anesthesia Procedure Notes (Signed)
Procedure Name: Intubation Date/Time: 10/10/2013 7:30 AM Performed by: Gaylene Brooks Pre-anesthesia Checklist: Patient identified, Timeout performed, Emergency Drugs available, Suction available and Patient being monitored Patient Re-evaluated:Patient Re-evaluated prior to inductionOxygen Delivery Method: Circle system utilized Preoxygenation: Pre-oxygenation with 100% oxygen Intubation Type: IV induction Ventilation: Mask ventilation without difficulty Laryngoscope Size: Miller and 2 Grade View: Grade I Tube type: Oral Tube size: 7.0 mm Number of attempts: 1 Airway Equipment and Method: Stylet Placement Confirmation: ETT inserted through vocal cords under direct vision,  breath sounds checked- equal and bilateral,  positive ETCO2 and CO2 detector Secured at: 21 cm Tube secured with: Tape Dental Injury: Teeth and Oropharynx as per pre-operative assessment

## 2013-10-10 NOTE — Progress Notes (Signed)
UR completed 

## 2013-10-10 NOTE — Evaluation (Signed)
Physical Therapy Evaluation Patient Details Name: Cynthia Travis MRN: [id number] DOB: August 15, 1946 Today's Date: 10/10/2013 Time: [phone] (with approx 10 minutes spent on BSC, so 3 units) PT Time Calculation (min): 47 min  PT Assessment / Plan / Recommendation History of Present Illness  s/p LTHA  Clinical Impression  Pt is s/p THA resulting in the deficits listed below (see PT Problem List).  Pt will benefit from skilled PT to increase their independence and safety with mobility to allow discharge to the venue listed below.        PT Assessment  Patient needs continued PT services    Follow Up Recommendations  Home health PT    Does the patient have the potential to tolerate intense rehabilitation      Barriers to Discharge        Equipment Recommendations  Rolling walker with 5" wheels;3in1 (PT)    Recommendations for Other Services OT consult   Frequency 7X/week    Precautions / Restrictions Precautions Precautions: Posterior Hip Restrictions LLE Weight Bearing: Weight bearing as tolerated   Pertinent Vitals/Pain Did not mention much pain with mobility; just nausea and vomiting patient repositioned for comfort       Mobility  Bed Mobility Overal bed mobility: Needs Assistance Bed Mobility: Supine to Sit;Sit to Supine Supine to sit: Min assist Sit to supine: Min assist General bed mobility comments: Cues for technique Transfers Overall transfer level: Needs assistance Equipment used: Rolling walker (2 wheeled) Transfers: Sit to/from Stand Sit to Stand: Min assist General transfer comment: Cues for safety, hand placement and technique Ambulation/Gait Ambulation/Gait assistance: Min guard Ambulation Distance (Feet): 3 Feet Assistive device: Rolling walker (2 wheeled) General Gait Details: Pivot steps BSC to bed    Exercises     PT Diagnosis: Difficulty walking;Acute pain  PT Problem List: Decreased strength;Decreased range of motion;Decreased activity  tolerance;Decreased mobility;Decreased knowledge of use of DME;Decreased knowledge of precautions;Pain PT Treatment Interventions: DME instruction;Gait training;Stair training;Functional mobility training;Therapeutic activities;Therapeutic exercise;Patient/family education     PT Goals(Current goals can be found in the care plan section) Acute Rehab PT Goals Patient Stated Goal: Go ona cruise PT Goal Formulation: With patient Time For Goal Achievement: 10/17/13 Potential to Achieve Goals: Good  Visit Information  Last PT Received On: 10/10/13 Assistance Needed: +1 History of Present Illness: s/p LTHA       Prior [place] expects to be discharged to:: Private residence Living Arrangements: Spouse/significant other Available Help at Discharge: Family;Available 24 hours/day Type of Home: House Home Access: Stairs to enter [employer] of Steps: 2-3 Entrance Stairs-Rails: Right;Left Home Layout: Two level Alternate Level Stairs-Number of Steps: 7-8 Alternate Level Stairs-Rails: Right Home Equipment: None Prior Function Level of Independence: Independent Communication Communication: No difficulties    Cognition  Cognition Arousal/Alertness: Awake/alert Behavior During Therapy: WFL for tasks assessed/performed Overall Cognitive Status: Within Functional Limits for tasks assessed    Extremity/Trunk Assessment Upper Extremity Assessment Upper Extremity Assessment: Overall WFL for tasks assessed Lower Extremity Assessment Lower Extremity Assessment: LLE deficits/detail LLE Deficits / Details: Grossly decr AROM and strength postop   Balance    End of Session PT - End of Session Activity Tolerance: Patient tolerated treatment well;Other (comment) (though with nausea and vomiting) Patient left: in bed;with call bell/phone within reach;with family/visitor present Nurse Communication: Mobility status  GP     [place], [place], [place]  10/10/2013, 4:07 PM

## 2013-10-10 NOTE — Anesthesia Postprocedure Evaluation (Signed)
  Anesthesia Post-op Note  Patient: Cynthia Travis  Procedure(s) Performed: Procedure(s): TOTAL HIP ARTHROPLASTY (Left)  Patient Location: PACU  Anesthesia Type:General  Level of Consciousness: awake  Airway and Oxygen Therapy: Patient Spontanous Breathing  Post-op Pain: mild  Post-op Assessment: Post-op Vital signs reviewed, Patient's Cardiovascular Status Stable, Respiratory Function Stable, Patent Airway, No signs of Nausea or vomiting and Pain level controlled  Post-op Vital Signs: Reviewed and stable  Complications: No apparent anesthesia complications

## 2013-10-10 NOTE — Anesthesia Preprocedure Evaluation (Addendum)
Anesthesia Evaluation  Patient identified by MRN, date of birth, ID band Patient awake    Reviewed: Allergy & Precautions, H&P , NPO status , Patient's Chart, lab work & pertinent test results  Airway Mallampati: I TM Distance: >3 FB Neck ROM: Full    Dental  (+) Teeth Intact and Dental Advisory Given   Pulmonary COPDCurrent Smoker,  + rhonchi         Cardiovascular Rhythm:Regular Rate:Normal     Neuro/Psych    GI/Hepatic hiatal hernia, GERD-  ,  Endo/Other    Renal/GU      Musculoskeletal   Abdominal   Peds  Hematology   Anesthesia Other Findings H/o breast ca  Reproductive/Obstetrics                          Anesthesia Physical Anesthesia Plan  ASA: II  Anesthesia Plan: General   Post-op Pain Management:    Induction: Intravenous  Airway Management Planned: Oral ETT  Additional Equipment:   Intra-op Plan:   Post-operative Plan: Extubation in OR  Informed Consent: I have reviewed the patients History and Physical, chart, labs and discussed the procedure including the risks, benefits and alternatives for the proposed anesthesia with the patient or authorized representative who has indicated his/her understanding and acceptance.     Plan Discussed with: CRNA and Surgeon  Anesthesia Plan Comments:         Anesthesia Quick Evaluation

## 2013-10-11 LAB — BASIC METABOLIC PANEL
BUN: 6 mg/dL (ref 6–23)
CALCIUM: 8.5 mg/dL (ref 8.4–10.5)
CO2: 24 meq/L (ref 19–32)
CREATININE: 0.57 mg/dL (ref 0.50–1.10)
Chloride: 104 mEq/L (ref 96–112)
GFR calc Af Amer: >90 mL/min (ref >90)
GFR calc non Af Amer: >90 mL/min (ref >90)
GLUCOSE: 116 mg/dL — AB (ref 70–99)
Potassium: 4 mEq/L (ref 3.7–5.3)
Sodium: 139 mEq/L (ref 137–147)

## 2013-10-11 LAB — CBC
HCT: 30.7 % — ABNORMAL LOW (ref 36.0–46.0)
HEMOGLOBIN: 10.7 g/dL — AB (ref 12.0–15.0)
MCH: 30.4 pg (ref 26.0–34.0)
MCHC: 34.9 g/dL (ref 30.0–36.0)
MCV: 87.2 fL (ref 78.0–100.0)
Platelets: 257 10*3/uL (ref 150–400)
RBC: 3.52 MIL/uL — AB (ref 3.87–5.11)
RDW: 13.7 % (ref 11.5–15.5)
WBC: 10.7 10*3/uL — ABNORMAL HIGH (ref 4.0–10.5)

## 2013-10-11 NOTE — Progress Notes (Signed)
Orthopedic Tech Progress Note Patient Details:  Cynthia Travis January 18, 1946 [id number] Patient refused ohf Patient ID: Cynthia Travis, female   DOB: 06-Dec-1945, 68 y.o.   MRN: [id number]   Cynthia Travis 10/11/2013, 6:17 PM

## 2013-10-11 NOTE — Evaluation (Signed)
Occupational Therapy Evaluation Patient Details Name: Cynthia Travis MRN: [id number] DOB: Jul 17, 1946 Today's Date: 10/11/2013 Time: 6606-3016 OT Time Calculation (min): 21 min  OT Assessment / Plan / Recommendation History of present illness s/p LTHA   Clinical Impression   Pt is at set up/sup level with UB ADLs and min-mod A with LB ADLs, sup with ADL mobility. Pt's husband will be assisting her 24/7 as needed. All education completed and no further acute OT services indicated at this time    OT Assessment  Patient does not need any further OT services    Follow Up Recommendations  No OT follow up;Supervision - Intermittent    Barriers to Discharge  none    Equipment Recommendations  None recommended by OT    Recommendations for Other Services    Frequency       Precautions / Restrictions Precautions Precautions: Posterior Hip Precaution Comments: Pt able to recall 3/3 hip precautions Restrictions Weight Bearing Restrictions: Yes LLE Weight Bearing: Weight bearing as tolerated   Pertinent Vitals/Pain 3/10 L hip    ADL  Grooming: Wash/dry hands;Wash/dry face;Supervision/safety;Min guard;Set up Where Assessed - Grooming: Unsupported sitting;Supported standing Upper Body Bathing: Simulated;Supervision/safety;Set up Where Assessed - Upper Body Bathing: Unsupported sitting Lower Body Bathing: Simulated;Minimal assistance Upper Body Dressing: Performed;Supervision/safety;Set up Where Assessed - Upper Body Dressing: Unsupported sitting Lower Body Dressing: Performed;Minimal assistance Toilet Transfer: Performed;Supervision/safety Toilet Transfer Method: Sit to stand Toilet Transfer Equipment: Regular height toilet;Grab bars;Raised toilet seat with arms (or 3-in-1 over toilet) Toileting - Clothing Manipulation and Hygiene: Performed;Min guard;Supervision/safety Where Assessed - Camera operator Manipulation and Hygiene: Standing Tub/Shower Transfer: Performed Tub/Shower  Transfer Method: Therapist, art: Shower seat without back;Grab bars;Walk in shower Equipment Used: Rolling walker (3 in 1) Transfers/Ambulation Related to ADLs: cues for hand placement & body positioning before sitting ADL Comments: Pt and her husband familair with ADL A/E form pre op class    OT Diagnosis:    OT Problem List:   OT Treatment Interventions:     OT Goals(Current goals can be found in the care plan section) Acute Rehab OT Goals Patient Stated Goal: go back to traveling   Visit Information  Last OT Received On: 10/11/13 History of Present Illness: s/p LTHA       Prior [place] expects to be discharged to:: Private residence Living Arrangements: Spouse/significant other Available Help at Discharge: Family;Available 24 hours/day Type of Home: House Home Access: Stairs to enter [employer] of Steps: 2-3 Entrance Stairs-Rails: Right;Left Home Layout: Two level Alternate Level Stairs-Number of Steps: 7-8 Alternate Level Stairs-Rails: Right Home Equipment: None Prior Function Level of Independence: Independent Communication Communication: No difficulties Dominant Hand: Right         Vision/Perception Vision - History Baseline Vision: Wears glasses all the time Patient Visual Report: No change from baseline Perception Perception: Within Functional Limits   Cognition  Cognition Arousal/Alertness: Awake/alert Behavior During Therapy: WFL for tasks assessed/performed Overall Cognitive Status: Within Functional Limits for tasks assessed    Extremity/Trunk Assessment Upper Extremity Assessment Upper Extremity Assessment: Overall WFL for tasks assessed Lower Extremity Assessment Lower Extremity Assessment: Defer to PT evaluation Cervical / Trunk Assessment Cervical / Trunk Assessment: Normal     Mobility Bed Mobility General bed mobility comments: not assessed, pt up in  recliner Transfers Overall transfer level: Needs assistance Equipment used: Rolling walker (2 wheeled) Transfers: Sit to/from Stand Sit to Stand: Supervision General transfer comment: cues for hand placement &  body positioning before sitting     Exercise     Balance Balance Overall balance assessment: No apparent balance deficits (not formally assessed)   End of Session OT - End of Session Equipment Utilized During Treatment: Rolling walker Activity Tolerance: Patient tolerated treatment well Patient left: in chair;with call bell/phone within reach;with family/visitor present  GO     Britt Bottom 10/11/2013, 1:26 PM

## 2013-10-11 NOTE — Progress Notes (Signed)
Patient ID: Cynthia Travis, female   DOB: November 03, 1945, 68 y.o.   MRN: [id number] PATIENT ID: Cynthia Travis  MRN: [id number]  DOB/AGE:  [birth year] / 68 y.o.  1 Day Post-Op Procedure(s) (LRB): TOTAL HIP ARTHROPLASTY (Left)    PROGRESS NOTE Subjective: Patient is alert, oriented,1x Nausea, no Vomiting, yes passing gas, no Bowel Movement. Taking PO well. Denies SOB, Chest or Calf Pain. Using Incentive Spirometer, PAS in place. Ambulate WBAT in room Patient reports pain as 2 on 0-10 scale  .    Objective: Vital signs in last 24 hours: Filed Vitals:   10/10/13 1516 10/10/13 1600 10/10/13 2158 10/11/13 0626  BP: 121/60  133/55 135/56  Pulse: 68  89 82  Temp: 97.5 F (36.4 C)  98.7 F (37.1 C) 97.8 F (36.6 C)  TempSrc:      Resp: 16 16 18 18   Height:      Weight:      SpO2: 100% 100% 100% 100%      Intake/Output from previous day: I/O last 3 completed shifts: In: 5361 [P.O.:360; I.V.:2600; IV Piggyback:250] Out: 353 [Urine:3; Blood:350]   Intake/Output this shift:     LABORATORY DATA:  Recent Labs  10/10/13 0547 10/11/13 0507  WBC  --  10.7*  HGB  --  10.7*  HCT  --  30.7*  PLT  --  257  NA  --  139  K  --  4.0  CL  --  104  CO2  --  24  BUN  --  6  CREATININE  --  0.57  GLUCOSE  --  116*  GLUCAP 104*  --   CALCIUM  --  8.5    Examination: Neurologically intact ABD soft Neurovascular intact Sensation intact distally Intact pulses distally Dorsiflexion/Plantar flexion intact Incision: no drainage No cellulitis present Compartment soft} XR AP&Lat of hip shows well placed\fixed THA  Assessment:   1 Day Post-Op Procedure(s) (LRB): TOTAL HIP ARTHROPLASTY (Left) ADDITIONAL DIAGNOSIS:    Plan: PT/OT WBAT, THA  posterior precautions  DVT Prophylaxis: SCDx72 hrs, ASA 325 mg BID x 2 weeks  DISCHARGE PLAN: Home, probably tomorrow when she passes physical therapy  DISCHARGE NEEDS: HHPT, HHRN, CPM, Walker and 3-in-1 comode seat

## 2013-10-11 NOTE — Progress Notes (Signed)
Physical Therapy Treatment Patient Details Name: Cynthia Travis MRN: [id number] DOB: 03/22/1946 Today's Date: 10/11/2013 Time: 4098-1191 PT Time Calculation (min): 27 min  PT Assessment / Plan / Recommendation  History of Present Illness s/p LTHA   PT Comments   Pt making great progress with mobility/PT goals.   Pt states plans are possibly for her to d/c home later today.  Pt able to ambulate from room<>ortho gym & complete stair training.     Follow Up Recommendations  Home health PT     Does the patient have the potential to tolerate intense rehabilitation     Barriers to Discharge        Equipment Recommendations  Rolling walker with 5" wheels;3in1 (PT)    Recommendations for Other Services    Frequency 7X/week   Progress towards PT Goals Progress towards PT goals: Progressing toward goals  Plan Current plan remains appropriate    Precautions / Restrictions Precautions Precautions: Posterior Hip Precaution Comments: Reviewed hip precautions Restrictions Weight Bearing Restrictions: Yes LLE Weight Bearing: Weight bearing as tolerated       Mobility  Bed Mobility Overal bed mobility: Modified Independent Bed Mobility: Supine to Sit Transfers Overall transfer level: Needs assistance Equipment used: Rolling walker (2 wheeled) Transfers: Sit to/from Stand Sit to Stand: Supervision General transfer comment: cues for hand placement & body positioning before sitting Ambulation/Gait Ambulation/Gait assistance: Supervision Ambulation Distance (Feet): 200 Feet Assistive device: Rolling walker (2 wheeled) Gait Pattern/deviations: Step-through pattern;Decreased stride length General Gait Details: cues for sequencing & safe RW use.   Stairs: Yes Stairs assistance: Min assist Stair Management: No rails;Step to pattern;Backwards;Forwards;Two rails Number of Stairs: 3 (2x's) General stair comments: Backwards with walker to simulate outside steps; forwards with bil rails to  simulate inside steps.  Cues for sequencing & technique.  Pt's husband present for education.        PT Goals (current goals can now be found in the care plan section) Acute Rehab PT Goals Patient Stated Goal: Go ona cruise PT Goal Formulation: With patient Time For Goal Achievement: 10/17/13 Potential to Achieve Goals: Good  Visit Information  Last PT Received On: 10/11/13 Assistance Needed: +1 History of Present Illness: s/p LTHA    Subjective Data  Patient Stated Goal: Go ona cruise   Cognition  Cognition Arousal/Alertness: Awake/alert Behavior During Therapy: WFL for tasks assessed/performed Overall Cognitive Status: Within Functional Limits for tasks assessed    Balance     End of Session PT - End of Session Activity Tolerance: Patient tolerated treatment well Patient left: in chair;with call bell/phone within reach;with family/visitor present Nurse Communication: Mobility status   GP     Sena Hitch 10/11/2013, 9:42 AM   Sarajane Marek, PTA [phone] 10/11/2013

## 2013-10-11 NOTE — Discharge Instructions (Signed)
Home Health Physical therapy to be provided by [facility]  [phone]

## 2013-10-11 NOTE — Progress Notes (Signed)
Physical Therapy Treatment Patient Details Name: Cynthia Travis MRN: [id number] DOB: 10/03/1945 Today's Date: 10/11/2013 Time: 4098-1191 PT Time Calculation (min): 23 min  PT Assessment / Plan / Recommendation  History of Present Illness s/p LTHA   PT Comments   Pt cont's to make steady progress with mobility.  Educated pt on HEP.    Follow Up Recommendations  Home health PT     Does the patient have the potential to tolerate intense rehabilitation     Barriers to Discharge        Equipment Recommendations  Rolling walker with 5" wheels;3in1 (PT)    Recommendations for Other Services OT consult  Frequency 7X/week   Progress towards PT Goals Progress towards PT goals: Progressing toward goals  Plan Current plan remains appropriate    Precautions / Restrictions Precautions Precautions: Posterior Hip Precaution Comments: Pt able to recall 3/3 hip precautions Restrictions Weight Bearing Restrictions: Yes LLE Weight Bearing: Weight bearing as tolerated       Mobility  Bed Mobility Overal bed mobility: Modified Independent Bed Mobility: Supine to Sit;Sit to Supine Supine to sit: Modified independent (Device/Increase time) Sit to supine: Modified independent (Device/Increase time) General bed mobility comments: not assessed, pt up in recliner Transfers Overall transfer level: Modified independent Equipment used: Rolling walker (2 wheeled) Transfers: Sit to/from Stand Sit to Stand: Supervision General transfer comment: Pt demonstrated safe hand placement Ambulation/Gait Ambulation/Gait assistance: Supervision Ambulation Distance (Feet): 400 Feet Assistive device: Rolling walker (2 wheeled) Gait Pattern/deviations: Step-through pattern General Gait Details: Pt progressing with fluidity of gait pattern    Exercises Total Joint Exercises Ankle Circles/Pumps: AROM;Both;10 reps Quad Sets: AROM;Strengthening;Both;10 reps Gluteal Sets: AROM;Strengthening;Both;10 reps Heel  Slides: AROM;Strengthening;Left;10 reps Hip ABduction/ADduction: AROM;Strengthening;Left;10 reps Straight Leg Raises: AROM;Strengthening;Left;10 reps Long Arc Quad: AROM;Strengthening;Left;10 reps     PT Goals (current goals can now be found in the care plan section) Acute Rehab PT Goals Patient Stated Goal: go back to traveling  PT Goal Formulation: With patient Time For Goal Achievement: 10/17/13 Potential to Achieve Goals: Good  Visit Information  Last PT Received On: 10/11/13 Assistance Needed: +1 History of Present Illness: s/p LTHA    Subjective Data  Patient Stated Goal: go back to traveling    Cognition  Cognition Arousal/Alertness: Awake/alert Behavior During Therapy: WFL for tasks assessed/performed Overall Cognitive Status: Within Functional Limits for tasks assessed    Balance  Balance Overall balance assessment: No apparent balance deficits (not formally assessed)  End of Session PT - End of Session Activity Tolerance: Patient tolerated treatment well Patient left: in bed;with call bell/phone within reach;with family/visitor present Nurse Communication: Mobility status   GP     Sena Hitch 10/11/2013, 3:12 PM   Sarajane Marek, PTA [phone] 10/11/2013

## 2013-10-11 NOTE — Care Management Note (Signed)
CARE MANAGEMENT NOTE 10/11/2013  Patient:  Cynthia Travis, Cynthia Travis   Account Number:  [id number]  Date Initiated:  10/11/2013  Documentation initiated by:  Ricki Miller  Subjective/Objective Assessment:   68 yr old female s/p left total hip arthroplasty.     Action/Plan:   Case Manager spoke with patient concerning home health and DME needs. Patient preoperatively setup with [facility], no changes. Has family support at discharge. Patient states she doesnt need 3in1, only walker.   Anticipated DC Date:  10/12/2013   Anticipated DC Plan:  [place] [facility]  CM consult      [place]   Choice offered to / List presented to:  C-1 Patient   DME arranged  Albertha Ghee      DME agency  [place] arranged  [place] agency  [place]   Status of service:  Completed, signed off Medicare Important Message given?   (If response is "NO", the following Medicare IM given date fields will be blank) Date Medicare IM given:   Date Additional Medicare IM given:    Discharge Disposition:  [place]

## 2013-10-12 LAB — CBC
HCT: 30.1 % — ABNORMAL LOW (ref 36.0–46.0)
Hemoglobin: 10 g/dL — ABNORMAL LOW (ref 12.0–15.0)
MCH: 29.6 pg (ref 26.0–34.0)
MCHC: 33.2 g/dL (ref 30.0–36.0)
MCV: 89.1 fL (ref 78.0–100.0)
PLATELETS: 245 10*3/uL (ref 150–400)
RBC: 3.38 MIL/uL — ABNORMAL LOW (ref 3.87–5.11)
RDW: 13.9 % (ref 11.5–15.5)
WBC: 11.1 10*3/uL — ABNORMAL HIGH (ref 4.0–10.5)

## 2013-10-12 NOTE — Progress Notes (Signed)
Physical Therapy Treatment Patient Details Name: Cynthia Travis MRN: [id number] DOB: [birth year] Today's Date: 10/12/2013 Time: 3244-0102 PT Time Calculation (min): 25 min  PT Assessment / Plan / Recommendation  History of Present Illness s/p LTHA   PT Comments   Pt cont's to move well.  Able to ambulate entire unit, complete stair training, &  Perform LE there-ex without assistance.     Follow Up Recommendations  Home health PT     Does the patient have the potential to tolerate intense rehabilitation     Barriers to Discharge        Equipment Recommendations  Rolling walker with 5" wheels;3in1 (PT)    Recommendations for Other Services    Frequency 7X/week   Progress towards PT Goals Progress towards PT goals: Progressing toward goals  Plan Current plan remains appropriate    Precautions / Restrictions Precautions Precautions: Posterior Hip Precaution Comments: Pt able to recall 3/3 hip precautions Restrictions LLE Weight Bearing: Weight bearing as tolerated       Mobility  Bed Mobility Overal bed mobility: Modified Independent Bed Mobility: Supine to Sit Transfers Overall transfer level: Modified independent Equipment used: Rolling walker (2 wheeled) Transfers: Sit to/from Stand General transfer comment: cues to reinforce hand placement Ambulation/Gait Ambulation/Gait assistance: Supervision Ambulation Distance (Feet): 500 Feet Assistive device: Rolling walker (2 wheeled) Gait Pattern/deviations: Step-through pattern;Decreased stride length General Gait Details: Cont's to improve with fluidity of gait & tolerating increased WBing through LLE.   Stairs: Yes Stairs assistance: Supervision Stair Management: No rails;Two rails;Backwards;Forwards;With walker Number of Stairs: 3 (2x's) General stair comments: Pt's husband assisted with backwards technique requiring min cueing for sequencing & technique from therapist.  Supervision with forwards technique     Exercises Total Joint Exercises Ankle Circles/Pumps: AROM;Both;15 reps Quad Sets: AROM;Strengthening;Both;15 reps Gluteal Sets: AROM;Both;15 reps Heel Slides: AROM;Left;15 reps Hip ABduction/ADduction: AROM;Left;15 reps Straight Leg Raises: AROM;Left;15 reps Long Arc Quad: AROM;Left;15 reps Marching in Standing: AROM;Left;15 reps;Seated (recliner reclined to prevent hip flexion >90 degrees)     PT Goals (current goals can now be found in the care plan section) Acute Rehab PT Goals PT Goal Formulation: With patient Time For Goal Achievement: 10/17/13 Potential to Achieve Goals: Good  Visit Information  Last PT Received On: 10/12/13 Assistance Needed: +1 History of Present Illness: s/p LTHA    Subjective Data      Cognition  Cognition Arousal/Alertness: Awake/alert Behavior During Therapy: WFL for tasks assessed/performed Overall Cognitive Status: Within Functional Limits for tasks assessed    Balance     End of Session PT - End of Session Activity Tolerance: Patient tolerated treatment well Patient left: in chair;with call bell/phone within reach;with family/visitor present Nurse Communication: Mobility status   GP     Sena Hitch 10/12/2013, 2:03 PM   Sarajane Marek, PTA [phone] 10/12/2013

## 2013-10-12 NOTE — Discharge Summary (Signed)
Patient ID: Cynthia Travis MRN: [id number] DOB/AGE: 01-18-1946 68 y.o.  Admit date: 10/10/2013 Discharge date: 10/12/2013  Admission Diagnoses:  Active Problems:   Hip arthritis   Discharge Diagnoses:  Same  Past Medical History  Diagnosis Date  . Breast cancer   . H/O hiatal hernia   . GERD (gastroesophageal reflux disease)   . Fecal urgency     at times occured after colon surgery  . Arthritis   . Psoriasis     hands, knuckles, & belly button    Surgeries: Procedure(s): TOTAL HIP ARTHROPLASTY on 10/10/2013   Consultants:    Discharged Condition: Improved  Hospital Course: Cynthia Travis is an 68 y.o. female who was admitted 10/10/2013 for operative treatment of<principal problem not specified>. Patient has severe unremitting pain that affects sleep, daily activities, and work/hobbies. After pre-op clearance the patient was taken to the operating room on 10/10/2013 and underwent  Procedure(s): TOTAL HIP ARTHROPLASTY.    Patient was given perioperative antibiotics: Anti-infectives   Start     Dose/Rate Route Frequency Ordered Stop   10/10/13 0600  ceFAZolin (ANCEF) IVPB 2 g/50 mL premix     2 g 100 mL/hr over 30 Minutes Intravenous On call to O.R. 10/09/13 1318 10/10/13 0735       Patient was given sequential compression devices, early ambulation, and chemoprophylaxis to prevent DVT.  Patient benefited maximally from hospital stay and there were no complications.    Recent vital signs: Patient Vitals for the past 24 hrs:  BP Temp Temp src Pulse Resp SpO2  10/12/13 0430 120/49 mmHg 98 F (36.7 C) - 87 18 97 %  10/12/13 0000 - - - - 18 -  10/11/13 2037 129/49 mmHg 98.6 F (37 C) - 88 18 98 %  10/11/13 1600 - - - - 18 96 %  10/11/13 1514 122/45 mmHg 99.6 F (37.6 C) Oral 81 20 100 %  10/11/13 1200 - - - - 18 98 %     Recent laboratory studies:  Recent Labs  10/11/13 0507 10/12/13 0543  WBC 10.7* 11.1*  HGB 10.7* 10.0*  HCT 30.7* 30.1*  PLT 257 245  NA 139   --   K 4.0  --   CL 104  --   CO2 24  --   BUN 6  --   CREATININE 0.57  --   GLUCOSE 116*  --   CALCIUM 8.5  --      Discharge Medications:     Medication List    STOP taking these medications       meloxicam 15 MG tablet  Commonly known as:  MOBIC      TAKE these medications       aspirin EC 325 MG tablet  Take 1 tablet (325 mg total) by mouth 2 (two) times daily.     methocarbamol 500 MG tablet  Commonly known as:  ROBAXIN  Take 1 tablet (500 mg total) by mouth 2 (two) times daily with a meal.     oxyCODONE-acetaminophen 5-325 MG per tablet  Commonly known as:  ROXICET  Take 1 tablet by mouth every 4 (four) hours as needed.     VITAMIN C PO  Take 1 tablet by mouth daily.     VITAMIN D PO  Take 1 tablet by mouth daily.        Diagnostic Studies: Dg Chest 2 View  10/03/2013   CLINICAL DATA:  Preop evaluation  EXAM: CHEST  2 VIEW  COMPARISON:  None.  FINDINGS: The heart size and mediastinal contours are within normal limits. Both lungs are clear. The visualized skeletal structures are unremarkable.  IMPRESSION: No active cardiopulmonary disease.   Electronically Signed   By: Margaree Mackintosh M.D.   On: 10/03/2013 15:23   Dg Pelvis Portable  10/10/2013   CLINICAL DATA:  Postop left hip replacement  EXAM: PORTABLE PELVIS 1-2 VIEWS  COMPARISON:  Nine A 2006  FINDINGS: Left hip replacement in satisfactory position and alignment. No fracture or acute complication.  IMPRESSION: Satisfactory left hip replacement.   Electronically Signed   By: Franchot Gallo M.D.   On: 10/10/2013 10:11    Disposition:       Discharge Orders   Future Orders Complete By Expires   Call MD / Call 911  As directed    Comments:     If you experience chest pain or shortness of breath, CALL 911 and be transported to the hospital emergency room.  If you develope a fever above 101 F, pus (white drainage) or increased drainage or redness at the wound, or calf pain, call your surgeon's office.    Change dressing  As directed    Comments:     You may change your dressing on day 5, then change the dressing daily with sterile 4 x 4 inch gauze dressing and paper tape.  You may clean the incision with alcohol prior to redressing   Constipation Prevention  As directed    Comments:     Drink plenty of fluids.  Prune juice may be helpful.  You may use a stool softener, such as Colace (over the counter) 100 mg twice a day.  Use MiraLax (over the counter) for constipation as needed.   Diet - low sodium heart healthy  As directed    Discharge instructions  As directed    Comments:     Follow up in 2 weeks with Dr. Mayer Camel   Driving restrictions  As directed    Comments:     No driving for 2 weeks   Follow the hip precautions as taught in Physical Therapy  As directed    Increase activity slowly as tolerated  As directed    Patient may shower  As directed    Comments:     You may shower without a dressing once there is no drainage.  Do not wash over the wound.  If drainage remains, cover wound with plastic wrap and then shower.      Follow-up Information   Follow up with Kerin Salen, MD In 2 weeks.   Specialty:  Orthopedic Surgery   Contact information:   [place] [address] [phone]        Signed: Theodosia Quay 10/12/2013, 8:19 AM

## 2013-10-12 NOTE — Progress Notes (Signed)
PATIENT ID: Cynthia Travis  MRN: [id number]  DOB/AGE:  12-13-1945 / 68 y.o.  2 Days Post-Op Procedure(s) (LRB): TOTAL HIP ARTHROPLASTY (Left)    PROGRESS NOTE Subjective: Patient is alert, oriented,no Nausea, no Vomiting, yes passing gas, no Bowel Movement. Taking PO well. Denies SOB, Chest or Calf Pain. Using Incentive Spirometer, PAS in place. Ambulate WBAT Patient reports pain as mild  .    Objective: Vital signs in last 24 hours: Filed Vitals:   10/11/13 1600 10/11/13 2037 10/12/13 0000 10/12/13 0430  BP:  129/49  120/49  Pulse:  88  87  Temp:  98.6 F (37 C)  98 F (36.7 C)  TempSrc:      Resp: 18 18 18 18   Height:      Weight:      SpO2: 96% 98%  97%      Intake/Output from previous day: I/O last 3 completed shifts: In: 1800 [P.O.:600; I.V.:1200] Out: 4 [Urine:4]   Intake/Output this shift:     LABORATORY DATA:  Recent Labs  10/10/13 0547 10/11/13 0507 10/12/13 0543  WBC  --  10.7* 11.1*  HGB  --  10.7* 10.0*  HCT  --  30.7* 30.1*  PLT  --  257 245  NA  --  139  --   K  --  4.0  --   CL  --  104  --   CO2  --  24  --   BUN  --  6  --   CREATININE  --  0.57  --   GLUCOSE  --  116*  --   GLUCAP 104*  --   --   CALCIUM  --  8.5  --     Examination: Neurologically intact Neurovascular intact Sensation intact distally Intact pulses distally Dorsiflexion/Plantar flexion intact Incision: dressing C/D/I and no drainage No cellulitis present Compartment soft} XR AP&Lat of hip shows well placed\fixed THA  Assessment:   2 Days Post-Op Procedure(s) (LRB): TOTAL HIP ARTHROPLASTY (Left) ADDITIONAL DIAGNOSIS:   Plan: PT/OT WBAT, THA  posterior precautions  DVT Prophylaxis: SCDx72 hrs, ASA 325 mg BID x 2 weeks  DISCHARGE PLAN: Home, today after therapy  DISCHARGE NEEDS: HHPT, HHRN, Walker and 3-in-1 comode seat

## 2013-10-13 ENCOUNTER — Other Ambulatory Visit: Payer: Self-pay | Admitting: Orthopedic Surgery

## 2013-10-13 ENCOUNTER — Encounter (HOSPITAL_COMMUNITY): Payer: Self-pay | Admitting: Orthopedic Surgery

## 2013-10-13 DIAGNOSIS — Z471 Aftercare following joint replacement surgery: Secondary | ICD-10-CM | POA: Diagnosis not present

## 2013-10-13 DIAGNOSIS — R279 Unspecified lack of coordination: Secondary | ICD-10-CM | POA: Diagnosis not present

## 2013-10-13 DIAGNOSIS — Z96649 Presence of unspecified artificial hip joint: Secondary | ICD-10-CM | POA: Diagnosis not present

## 2013-10-14 DIAGNOSIS — R279 Unspecified lack of coordination: Secondary | ICD-10-CM | POA: Diagnosis not present

## 2013-10-14 DIAGNOSIS — Z471 Aftercare following joint replacement surgery: Secondary | ICD-10-CM | POA: Diagnosis not present

## 2013-10-17 DIAGNOSIS — R279 Unspecified lack of coordination: Secondary | ICD-10-CM | POA: Diagnosis not present

## 2013-10-17 DIAGNOSIS — Z471 Aftercare following joint replacement surgery: Secondary | ICD-10-CM | POA: Diagnosis not present

## 2013-10-19 DIAGNOSIS — Z471 Aftercare following joint replacement surgery: Secondary | ICD-10-CM | POA: Diagnosis not present

## 2013-10-19 DIAGNOSIS — R279 Unspecified lack of coordination: Secondary | ICD-10-CM | POA: Diagnosis not present

## 2013-10-21 DIAGNOSIS — Z471 Aftercare following joint replacement surgery: Secondary | ICD-10-CM | POA: Diagnosis not present

## 2013-10-21 DIAGNOSIS — R279 Unspecified lack of coordination: Secondary | ICD-10-CM | POA: Diagnosis not present

## 2013-10-24 DIAGNOSIS — R279 Unspecified lack of coordination: Secondary | ICD-10-CM | POA: Diagnosis not present

## 2013-10-24 DIAGNOSIS — Z471 Aftercare following joint replacement surgery: Secondary | ICD-10-CM | POA: Diagnosis not present

## 2013-10-25 DIAGNOSIS — M1610 Unilateral primary osteoarthritis, unspecified hip: Secondary | ICD-10-CM | POA: Diagnosis not present

## 2013-10-26 DIAGNOSIS — Z471 Aftercare following joint replacement surgery: Secondary | ICD-10-CM | POA: Diagnosis not present

## 2013-10-26 DIAGNOSIS — R279 Unspecified lack of coordination: Secondary | ICD-10-CM | POA: Diagnosis not present

## 2013-10-28 DIAGNOSIS — Z471 Aftercare following joint replacement surgery: Secondary | ICD-10-CM | POA: Diagnosis not present

## 2013-10-28 DIAGNOSIS — R279 Unspecified lack of coordination: Secondary | ICD-10-CM | POA: Diagnosis not present

## 2013-10-31 DIAGNOSIS — Z471 Aftercare following joint replacement surgery: Secondary | ICD-10-CM | POA: Diagnosis not present

## 2013-10-31 DIAGNOSIS — R279 Unspecified lack of coordination: Secondary | ICD-10-CM | POA: Diagnosis not present

## 2013-11-02 DIAGNOSIS — Z471 Aftercare following joint replacement surgery: Secondary | ICD-10-CM | POA: Diagnosis not present

## 2013-11-02 DIAGNOSIS — R279 Unspecified lack of coordination: Secondary | ICD-10-CM | POA: Diagnosis not present

## 2014-02-07 ENCOUNTER — Other Ambulatory Visit: Payer: Self-pay

## 2014-02-07 DIAGNOSIS — Z1231 Encounter for screening mammogram for malignant neoplasm of breast: Secondary | ICD-10-CM

## 2014-03-15 ENCOUNTER — Encounter (INDEPENDENT_AMBULATORY_CARE_PROVIDER_SITE_OTHER): Payer: Self-pay

## 2014-03-15 ENCOUNTER — Ambulatory Visit
Admission: RE | Admit: 2014-03-15 | Discharge: 2014-03-15 | Disposition: A | Discharge status: Home / Self Care | Payer: Medicare Other | Source: Ambulatory Visit

## 2014-03-15 DIAGNOSIS — Z1231 Encounter for screening mammogram for malignant neoplasm of breast: Secondary | ICD-10-CM | POA: Diagnosis not present

## 2014-04-04 DIAGNOSIS — M25559 Pain in unspecified hip: Secondary | ICD-10-CM | POA: Diagnosis not present

## 2014-06-13 DIAGNOSIS — Z23 Encounter for immunization: Secondary | ICD-10-CM | POA: Diagnosis not present

## 2014-09-17 DIAGNOSIS — J329 Chronic sinusitis, unspecified: Secondary | ICD-10-CM | POA: Diagnosis not present

## 2014-10-10 DIAGNOSIS — Z471 Aftercare following joint replacement surgery: Secondary | ICD-10-CM | POA: Diagnosis not present

## 2014-10-10 DIAGNOSIS — Z96659 Presence of unspecified artificial knee joint: Secondary | ICD-10-CM | POA: Diagnosis not present

## 2014-10-31 DIAGNOSIS — H52223 Regular astigmatism, bilateral: Secondary | ICD-10-CM | POA: Diagnosis not present

## 2014-10-31 DIAGNOSIS — H524 Presbyopia: Secondary | ICD-10-CM | POA: Diagnosis not present

## 2014-10-31 DIAGNOSIS — H5203 Hypermetropia, bilateral: Secondary | ICD-10-CM | POA: Diagnosis not present

## 2014-10-31 DIAGNOSIS — H2513 Age-related nuclear cataract, bilateral: Secondary | ICD-10-CM | POA: Diagnosis not present

## 2014-10-31 DIAGNOSIS — H40033 Anatomical narrow angle, bilateral: Secondary | ICD-10-CM | POA: Diagnosis not present

## 2014-11-16 DIAGNOSIS — H269 Unspecified cataract: Secondary | ICD-10-CM | POA: Diagnosis not present

## 2014-11-16 DIAGNOSIS — H40039 Anatomical narrow angle, unspecified eye: Secondary | ICD-10-CM | POA: Diagnosis not present

## 2014-11-16 DIAGNOSIS — H527 Unspecified disorder of refraction: Secondary | ICD-10-CM | POA: Diagnosis not present

## 2014-12-14 DIAGNOSIS — H40031 Anatomical narrow angle, right eye: Secondary | ICD-10-CM | POA: Diagnosis not present

## 2015-01-12 DIAGNOSIS — H40032 Anatomical narrow angle, left eye: Secondary | ICD-10-CM | POA: Diagnosis not present

## 2015-01-26 DIAGNOSIS — H40033 Anatomical narrow angle, bilateral: Secondary | ICD-10-CM | POA: Diagnosis not present

## 2015-02-26 ENCOUNTER — Other Ambulatory Visit: Payer: Self-pay

## 2015-02-26 DIAGNOSIS — Z1231 Encounter for screening mammogram for malignant neoplasm of breast: Secondary | ICD-10-CM

## 2015-04-03 ENCOUNTER — Ambulatory Visit
Admission: RE | Admit: 2015-04-03 | Discharge: 2015-04-03 | Disposition: A | Discharge status: Home / Self Care | Payer: Medicare Other | Source: Ambulatory Visit

## 2015-04-03 DIAGNOSIS — Z1231 Encounter for screening mammogram for malignant neoplasm of breast: Secondary | ICD-10-CM

## 2015-04-10 DIAGNOSIS — Z01419 Encounter for gynecological examination (general) (routine) without abnormal findings: Secondary | ICD-10-CM | POA: Diagnosis not present

## 2015-04-10 DIAGNOSIS — Z124 Encounter for screening for malignant neoplasm of cervix: Secondary | ICD-10-CM | POA: Diagnosis not present

## 2015-04-18 DIAGNOSIS — Z13220 Encounter for screening for lipoid disorders: Secondary | ICD-10-CM | POA: Diagnosis not present

## 2015-04-18 DIAGNOSIS — Z1321 Encounter for screening for nutritional disorder: Secondary | ICD-10-CM | POA: Diagnosis not present

## 2015-04-18 DIAGNOSIS — Z0000 Encounter for general adult medical examination without abnormal findings: Secondary | ICD-10-CM | POA: Diagnosis not present

## 2015-04-19 ENCOUNTER — Other Ambulatory Visit: Payer: Self-pay | Admitting: Obstetrics and Gynecology

## 2015-04-19 DIAGNOSIS — M8580 Other specified disorders of bone density and structure, unspecified site: Secondary | ICD-10-CM

## 2015-04-27 ENCOUNTER — Ambulatory Visit
Admission: RE | Admit: 2015-04-27 | Discharge: 2015-04-27 | Disposition: A | Discharge status: Home / Self Care | Payer: Medicare Other | Source: Ambulatory Visit | Attending: Obstetrics and Gynecology | Admitting: Obstetrics and Gynecology

## 2015-04-27 DIAGNOSIS — M810 Age-related osteoporosis without current pathological fracture: Secondary | ICD-10-CM | POA: Diagnosis not present

## 2015-04-27 DIAGNOSIS — M8580 Other specified disorders of bone density and structure, unspecified site: Secondary | ICD-10-CM

## 2015-06-28 DIAGNOSIS — Z23 Encounter for immunization: Secondary | ICD-10-CM | POA: Diagnosis not present

## 2015-07-30 DIAGNOSIS — J329 Chronic sinusitis, unspecified: Secondary | ICD-10-CM | POA: Diagnosis not present

## 2015-09-22 DIAGNOSIS — J069 Acute upper respiratory infection, unspecified: Secondary | ICD-10-CM | POA: Diagnosis not present

## 2015-09-25 DIAGNOSIS — M940 Chondrocostal junction syndrome [Tietze]: Secondary | ICD-10-CM | POA: Diagnosis not present

## 2015-09-25 DIAGNOSIS — M545 Low back pain: Secondary | ICD-10-CM | POA: Diagnosis not present

## 2016-03-04 ENCOUNTER — Other Ambulatory Visit: Payer: Self-pay | Admitting: Obstetrics and Gynecology

## 2016-03-04 DIAGNOSIS — Z1231 Encounter for screening mammogram for malignant neoplasm of breast: Secondary | ICD-10-CM

## 2016-03-04 DIAGNOSIS — Z853 Personal history of malignant neoplasm of breast: Secondary | ICD-10-CM

## 2016-04-03 ENCOUNTER — Ambulatory Visit
Admission: RE | Admit: 2016-04-03 | Discharge: 2016-04-03 | Disposition: A | Discharge status: Home / Self Care | Payer: Medicare Other | Source: Ambulatory Visit | Attending: Obstetrics and Gynecology | Admitting: Obstetrics and Gynecology

## 2016-04-03 DIAGNOSIS — Z1231 Encounter for screening mammogram for malignant neoplasm of breast: Secondary | ICD-10-CM

## 2016-04-03 DIAGNOSIS — Z853 Personal history of malignant neoplasm of breast: Secondary | ICD-10-CM

## 2016-05-01 DIAGNOSIS — L821 Other seborrheic keratosis: Secondary | ICD-10-CM | POA: Diagnosis not present

## 2016-05-01 DIAGNOSIS — L989 Disorder of the skin and subcutaneous tissue, unspecified: Secondary | ICD-10-CM | POA: Diagnosis not present

## 2016-05-01 DIAGNOSIS — B070 Plantar wart: Secondary | ICD-10-CM | POA: Diagnosis not present

## 2016-05-09 DIAGNOSIS — L820 Inflamed seborrheic keratosis: Secondary | ICD-10-CM | POA: Diagnosis not present

## 2016-05-09 DIAGNOSIS — B078 Other viral warts: Secondary | ICD-10-CM | POA: Diagnosis not present

## 2016-05-09 DIAGNOSIS — Z1283 Encounter for screening for malignant neoplasm of skin: Secondary | ICD-10-CM | POA: Diagnosis not present

## 2016-05-09 DIAGNOSIS — B070 Plantar wart: Secondary | ICD-10-CM | POA: Diagnosis not present

## 2016-05-15 DIAGNOSIS — H524 Presbyopia: Secondary | ICD-10-CM | POA: Diagnosis not present

## 2016-05-15 DIAGNOSIS — H40013 Open angle with borderline findings, low risk, bilateral: Secondary | ICD-10-CM | POA: Diagnosis not present

## 2016-05-15 DIAGNOSIS — H25813 Combined forms of age-related cataract, bilateral: Secondary | ICD-10-CM | POA: Diagnosis not present

## 2016-05-29 ENCOUNTER — Other Ambulatory Visit: Payer: Self-pay

## 2016-06-02 DIAGNOSIS — Z23 Encounter for immunization: Secondary | ICD-10-CM | POA: Diagnosis not present

## 2016-06-24 DIAGNOSIS — M25562 Pain in left knee: Secondary | ICD-10-CM | POA: Diagnosis not present

## 2016-06-24 DIAGNOSIS — Z09 Encounter for follow-up examination after completed treatment for conditions other than malignant neoplasm: Secondary | ICD-10-CM | POA: Diagnosis not present

## 2016-06-24 DIAGNOSIS — Z96642 Presence of left artificial hip joint: Secondary | ICD-10-CM | POA: Diagnosis not present

## 2016-06-25 DIAGNOSIS — M79605 Pain in left leg: Secondary | ICD-10-CM | POA: Diagnosis not present

## 2016-06-26 DIAGNOSIS — H524 Presbyopia: Secondary | ICD-10-CM | POA: Diagnosis not present

## 2016-06-26 DIAGNOSIS — H25813 Combined forms of age-related cataract, bilateral: Secondary | ICD-10-CM | POA: Diagnosis not present

## 2016-06-26 DIAGNOSIS — H40013 Open angle with borderline findings, low risk, bilateral: Secondary | ICD-10-CM | POA: Diagnosis not present

## 2016-06-27 DIAGNOSIS — M79605 Pain in left leg: Secondary | ICD-10-CM | POA: Diagnosis not present

## 2016-06-30 DIAGNOSIS — M79605 Pain in left leg: Secondary | ICD-10-CM | POA: Diagnosis not present

## 2016-07-03 DIAGNOSIS — M79605 Pain in left leg: Secondary | ICD-10-CM | POA: Diagnosis not present

## 2016-07-08 DIAGNOSIS — M79605 Pain in left leg: Secondary | ICD-10-CM | POA: Diagnosis not present

## 2016-07-10 DIAGNOSIS — M79605 Pain in left leg: Secondary | ICD-10-CM | POA: Diagnosis not present

## 2016-07-15 DIAGNOSIS — M79605 Pain in left leg: Secondary | ICD-10-CM | POA: Diagnosis not present

## 2016-07-16 DIAGNOSIS — M79605 Pain in left leg: Secondary | ICD-10-CM | POA: Diagnosis not present

## 2016-07-21 DIAGNOSIS — M79605 Pain in left leg: Secondary | ICD-10-CM | POA: Diagnosis not present

## 2016-07-23 DIAGNOSIS — M79605 Pain in left leg: Secondary | ICD-10-CM | POA: Diagnosis not present

## 2016-08-08 DIAGNOSIS — J0100 Acute maxillary sinusitis, unspecified: Secondary | ICD-10-CM | POA: Diagnosis not present

## 2016-11-04 DIAGNOSIS — Z1389 Encounter for screening for other disorder: Secondary | ICD-10-CM | POA: Diagnosis not present

## 2016-11-04 DIAGNOSIS — Z1211 Encounter for screening for malignant neoplasm of colon: Secondary | ICD-10-CM | POA: Diagnosis not present

## 2016-11-04 DIAGNOSIS — Z87898 Personal history of other specified conditions: Secondary | ICD-10-CM | POA: Diagnosis not present

## 2016-11-04 DIAGNOSIS — M79604 Pain in right leg: Secondary | ICD-10-CM | POA: Diagnosis not present

## 2016-11-04 DIAGNOSIS — Z131 Encounter for screening for diabetes mellitus: Secondary | ICD-10-CM | POA: Diagnosis not present

## 2016-11-04 DIAGNOSIS — Z136 Encounter for screening for cardiovascular disorders: Secondary | ICD-10-CM | POA: Diagnosis not present

## 2016-11-04 DIAGNOSIS — Z0000 Encounter for general adult medical examination without abnormal findings: Secondary | ICD-10-CM | POA: Diagnosis not present

## 2016-11-11 ENCOUNTER — Other Ambulatory Visit: Payer: Self-pay | Admitting: *Deleted

## 2016-11-11 DIAGNOSIS — M79604 Pain in right leg: Secondary | ICD-10-CM

## 2016-11-13 DIAGNOSIS — Z1211 Encounter for screening for malignant neoplasm of colon: Secondary | ICD-10-CM | POA: Diagnosis not present

## 2016-12-17 ENCOUNTER — Encounter: Payer: Self-pay | Admitting: Vascular Surgery

## 2016-12-25 DIAGNOSIS — H40013 Open angle with borderline findings, low risk, bilateral: Secondary | ICD-10-CM | POA: Diagnosis not present

## 2016-12-25 DIAGNOSIS — H524 Presbyopia: Secondary | ICD-10-CM | POA: Diagnosis not present

## 2016-12-25 DIAGNOSIS — H25813 Combined forms of age-related cataract, bilateral: Secondary | ICD-10-CM | POA: Diagnosis not present

## 2016-12-30 ENCOUNTER — Ambulatory Visit (INDEPENDENT_AMBULATORY_CARE_PROVIDER_SITE_OTHER): Payer: Medicare Other | Admitting: Vascular Surgery

## 2016-12-30 ENCOUNTER — Encounter: Payer: Self-pay | Admitting: Vascular Surgery

## 2016-12-30 ENCOUNTER — Ambulatory Visit (HOSPITAL_COMMUNITY)
Admission: RE | Admit: 2016-12-30 | Discharge: 2016-12-30 | Disposition: A | Discharge status: Home / Self Care | Payer: Medicare Other | Source: Ambulatory Visit | Attending: Vascular Surgery | Admitting: Vascular Surgery

## 2016-12-30 VITALS — BP 133/71 | HR 69 | Temp 98.2°F | Resp 16 | Ht 63.5 in | Wt 160.5 lb

## 2016-12-30 DIAGNOSIS — M79604 Pain in right leg: Secondary | ICD-10-CM

## 2016-12-30 DIAGNOSIS — M79605 Pain in left leg: Secondary | ICD-10-CM

## 2016-12-30 NOTE — Progress Notes (Signed)
[facility]  Patient name: Cynthia Travis MRN: [id number] DOB: Mar 08, 1946 Sex: female  REASON FOR CONSULT: Burning and pain in bilateral lower extremities calf and feet  HPI: Cynthia Travis is a 71 y.o. female, who is seen today for evaluation of her lower extremity discomfort. She is not otherwise healthy 71 year old who reports burning and painful sensation in her calves extending into her feet. This occurs when she stands. If she sits and rests the discomfort resolves. She does not have any exercise discomfort. She can walk with no pain. She also reports if she sits for a period of time that she begins to have tingling in her calves and feet as if they "went to sleep". She has no history of lower extremity tissue loss. She does have a history of prior left hip replacement approximately 3 years ago. She does smoke cigarettes. Has no history of cardiac disease.  Past Medical History:  Diagnosis Date  . Arthritis   . Breast cancer ([place])   . Fecal urgency    at times occured after colon surgery  . GERD (gastroesophageal reflux disease)   . H/O hiatal hernia   . Psoriasis    hands, knuckles, & belly button    Family History  Problem Relation Age of Onset  . Diabetes Mother   . Heart disease Mother   . Heart disease Father   . Hypertension Father   . Stroke Father   . Diabetes Sister   . Cancer Brother     lung & colon  . Heart disease Brother   . Cancer Brother     esophageal  . Cancer Sister     esophageal    SOCIAL HISTORY: Social History   Social History  . Marital status: Married    Spouse name: N/A  . Number of children: N/A  . Years of education: N/A   Occupational History  . Not on file.   Social History Main Topics  . Smoking status: Current Every Day Smoker    Packs/day: 1.00    Years: 44.00    Types: Cigarettes  . Smokeless tobacco: Never Used  . Alcohol use 0.5 oz/week    1 Standard  drinks or equivalent per week     Comment: occasionally  . Drug use: No  . Sexual activity: Not on file   Other Topics Concern  . Not on file   Social History Narrative  . No narrative on file    No Known Allergies  Current Outpatient Prescriptions  Medication Sig Dispense Refill  . Ascorbic Acid (VITAMIN C PO) Take 1 tablet by mouth daily.    Marland Kitchen aspirin EC 325 MG tablet Take 1 tablet (325 mg total) by mouth 2 (two) times daily. 30 tablet 0  . Cholecalciferol (VITAMIN D PO) Take 1 tablet by mouth daily.    . methocarbamol (ROBAXIN) 500 MG tablet Take 1 tablet (500 mg total) by mouth 2 (two) times daily with a meal. 60 tablet 0  . oxyCODONE-acetaminophen (ROXICET) 5-325 MG per tablet Take 1 tablet by mouth every 4 (four) hours as needed. 60 tablet 0   No current facility-administered medications for this visit.     REVIEW OF SYSTEMS:  [X]  denotes positive finding, [ ]  denotes negative finding Cardiac  Comments:  Chest pain or chest pressure:    Shortness of breath upon exertion:    Short of breath when lying flat:    Irregular heart rhythm:  Vascular    Pain in calf, thigh, or hip brought on by ambulation:    Pain in feet at night that wakes you up from your sleep:     Blood clot in your veins:    Leg swelling:         Pulmonary    Oxygen at home:    Productive cough:     Wheezing:         Neurologic    Sudden weakness in arms or legs:     Sudden numbness in arms or legs:     Sudden onset of difficulty speaking or slurred speech:    Temporary loss of vision in one eye:     Problems with dizziness:         Gastrointestinal    Blood in stool:     Vomited blood:         Genitourinary    Burning when urinating:     Blood in urine:        Psychiatric    Major depression:         Hematologic    Bleeding problems:    Problems with blood clotting too easily:        Skin    Rashes or ulcers:        Constitutional    Fever or chills:      PHYSICAL  EXAM: Vitals:   12/30/16 0852  BP: 133/71  Pulse: 69  Resp: 16  Temp: 98.2 F (36.8 C)  TempSrc: Oral  SpO2: 100%  Weight: 160 lb 8 oz (72.8 kg)  Height: 5' 3.5" (1.613 m)    GENERAL: The patient is a well-nourished female, in no acute distress. The vital signs are documented above. CARDIOVASCULAR: Carotid arteries without bruits bilaterally. She has 2+ radial, femoral, popliteal, dorsalis pedis and posterior tibials bilaterally PULMONARY: There is good air exchange  ABDOMEN: Soft and non-tender . No aneurysm palpable MUSCULOSKELETAL: There are no major deformities or cyanosis. NEUROLOGIC: No focal weakness or paresthesias are detected. SKIN: There are no ulcers or rashes noted. PSYCHIATRIC: The patient has a normal affect.  DATA:  Gen. noninvasive vascular laboratory studies her office and I reviewed these with her. This shows normal ankle arm index and normal triphasic waveforms at the posterior tibial and dorsalis pedis bilaterally  MEDICAL ISSUES: I discussed these findings with patient. She does not have any evidence of lower external ureter insufficiency. Explain this does not appear to sound to be orthopedic in nature as well. Suspect this is neurologic. Most likely peripheral neurologic issues. Schisis with her primary care provider but may be appropriate to proceed with neurologic evaluation. She will see [name] again on as-needed basis   Rosetta Posner, MD [facility] of [facility] Tel [phone] Pager [phone]

## 2017-01-01 ENCOUNTER — Encounter: Payer: Self-pay | Admitting: Family Medicine

## 2017-01-13 ENCOUNTER — Encounter: Payer: Self-pay | Admitting: Neurology

## 2017-02-25 ENCOUNTER — Other Ambulatory Visit: Payer: Self-pay | Admitting: Obstetrics and Gynecology

## 2017-02-25 DIAGNOSIS — Z1231 Encounter for screening mammogram for malignant neoplasm of breast: Secondary | ICD-10-CM

## 2017-03-18 ENCOUNTER — Ambulatory Visit (INDEPENDENT_AMBULATORY_CARE_PROVIDER_SITE_OTHER): Payer: Medicare Other | Admitting: Neurology

## 2017-03-18 ENCOUNTER — Encounter: Payer: Self-pay | Admitting: Neurology

## 2017-03-18 ENCOUNTER — Other Ambulatory Visit (INDEPENDENT_AMBULATORY_CARE_PROVIDER_SITE_OTHER): Payer: Medicare Other

## 2017-03-18 VITALS — BP 120/70 | HR 71 | Ht 63.5 in | Wt 160.3 lb

## 2017-03-18 DIAGNOSIS — M79604 Pain in right leg: Secondary | ICD-10-CM

## 2017-03-18 DIAGNOSIS — M79605 Pain in left leg: Secondary | ICD-10-CM

## 2017-03-18 LAB — TSH: TSH: 7.37 u[IU]/mL — ABNORMAL HIGH (ref 0.35–4.50)

## 2017-03-18 LAB — CK: Total CK: 83 U/L (ref 7–177)

## 2017-03-18 LAB — SEDIMENTATION RATE: Sed Rate: 16 mm/hr (ref 0–30)

## 2017-03-18 LAB — VITAMIN B12: Vitamin B-12: 380 pg/mL (ref 211–911)

## 2017-03-18 LAB — C-REACTIVE PROTEIN: CRP: 0.3 mg/dL — ABNORMAL LOW (ref 0.5–20.0)

## 2017-03-18 NOTE — Progress Notes (Signed)
[facility] Clinic Note - Initial Visit   Date: 03/18/17  Cynthia Travis MRN: [id number] DOB: Feb 23, 1946   Dear Dr. Nancy Fetter:  Thank you for your kind referral of [place] for consultation of bilateral leg pain. Although his history is well known to you, please allow [name] to reiterate it for the purpose of our medical record. The patient was accompanied to the clinic by self.    History of Present Illness: Cynthia Travis is a 71 y.o. right-handed Caucasian female with history of breast cancer s/p lumpectomy, tobacco use, and GERD presenting for evaluation of bilateral leg pain.  Starting in September 2017, she has achy and burning pain of both lower legs, over the calf region.  She occasionally she has tingling of the lateral lower legs. It is aggravated if she stands for 6-10 minutes.  Rest and sitting alleviates her symptoms.  She has tried ibuprofen which helps.  She is an avid traveller and was in [place] walking for 4 hours and had to take frequent breaks.  She denies any falls and walks unassisted.  She saw Dr. Donnetta Hutching [facility] who performed vascular studies which was normal.  She also saw her orthopaedic surgeon because she had left hip surgery who did not see any orthopeadic reason for her leg discomfort. Because there was leg length discrepancy, she completed PT but this did not help.  She denies any low back pain or muscle tenderness.    Past Medical History:  Diagnosis Date  . Arthritis   . Breast cancer ([place])   . Fecal urgency    at times occured after colon surgery  . GERD (gastroesophageal reflux disease)   . H/O hiatal hernia   . Psoriasis    hands, knuckles, & belly button    Past Surgical History:  Procedure Laterality Date  . ABDOMINAL HYSTERECTOMY  2000  . APPENDECTOMY    . BREAST SURGERY Right 1999   Partial mastectomy  . COLON SURGERY  2006   emergency  . COLONOSCOPY    . MASTECTOMY Right 1999   Partial  . TOTAL HIP  ARTHROPLASTY Left 10/10/2013   Procedure: TOTAL HIP ARTHROPLASTY;  Surgeon: Kerin Salen, MD;  Location: [facility];  Service: Orthopedics;  Laterality: Left;     Medications:  Outpatient Encounter Prescriptions as of 03/18/2017  Medication Sig  . aspirin EC 325 MG tablet Take 1 tablet (325 mg total) by mouth 2 (two) times daily. (Patient taking differently: Take 325 mg by mouth as needed. )  . Cholecalciferol (VITAMIN D PO) Take 1 tablet by mouth daily.  . [DISCONTINUED] Ascorbic Acid (VITAMIN C PO) Take 1 tablet by mouth daily.  . [DISCONTINUED] methocarbamol (ROBAXIN) 500 MG tablet Take 1 tablet (500 mg total) by mouth 2 (two) times daily with a meal.  . [DISCONTINUED] oxyCODONE-acetaminophen (ROXICET) 5-325 MG per tablet Take 1 tablet by mouth every 4 (four) hours as needed.   No facility-administered encounter medications on file as of 03/18/2017.      Allergies: No Known Allergies  Family History: Family History  Problem Relation Age of Onset  . Diabetes Mother   . Heart disease Mother   . Heart disease Father   . Hypertension Father   . Stroke Father   . Diabetes Sister   . Cancer Brother        lung & colon  . Heart disease Brother   . Cancer Brother  esophageal  . Cancer Sister        esophageal    Social History: Social History  Substance Use Topics  . Smoking status: Current Every Day Smoker    Packs/day: 1.00    Years: 44.00    Types: Cigarettes  . Smokeless tobacco: Never Used  . Alcohol use 0.5 oz/week    1 Standard drinks or equivalent per week     Comment: occasionally   Social History   Social History Narrative  . No narrative on file    Review of Systems:  CONSTITUTIONAL: No fevers, chills, night sweats, or weight loss.   EYES: No visual changes or eye pain ENT: No hearing changes.  No history of nose bleeds.   RESPIRATORY: No cough, wheezing and shortness of breath.   CARDIOVASCULAR: Negative for chest pain, and palpitations.   GI:  Negative for abdominal discomfort, blood in stools or black stools.  No recent change in bowel habits.   GU:  No history of incontinence.   MUSCLOSKELETAL: No history of joint pain or swelling.  No myalgias.   SKIN: Negative for lesions, rash, and itching.   HEMATOLOGY/ONCOLOGY: Negative for prolonged bleeding, bruising easily, and swollen nodes.  No history of cancer.   ENDOCRINE: Negative for cold or heat intolerance, polydipsia or goiter.   PSYCH:  No depression or anxiety symptoms.   NEURO: As Above.   Vital Signs:  BP 120/70   Pulse 71   Ht 5' 3.5" (1.613 m)   Wt 160 lb 5 oz (72.7 kg)   SpO2 97%   BMI 27.95 kg/m    General Medical Exam:   General:  Well appearing, comfortable.   Eyes/ENT: see cranial nerve examination.   Neck: No masses appreciated.  Full range of motion without tenderness.  No carotid bruits. Respiratory:  Clear to auscultation, good air entry bilaterally.   Cardiac:  Regular rate and rhythm, no murmur.   Extremities:  No deformities, edema, or skin discoloration.  Skin:  No rashes or lesions.  Neurological Exam: MENTAL STATUS including orientation to time, place, person, recent and remote memory, attention span and concentration, language, and fund of knowledge is normal.  Speech is not dysarthric.  CRANIAL NERVES: II:  No visual field defects.  Unremarkable fundi.   III-IV-VI: Pupils equal round and reactive to light.  Normal conjugate, extra-ocular eye movements in all directions of gaze.  No nystagmus.  No ptosis.   V:  Normal facial sensation.     VII:  Normal facial symmetry and movements.  VIII:  Normal hearing and vestibular function.   IX-X:  Normal palatal movement.   XI:  Normal shoulder shrug and head rotation.   XII:  Normal tongue strength and range of motion, no deviation or fasciculation.  MOTOR:  No atrophy, fasciculations or abnormal movements.  No pronator drift.  Tone is normal.    Right Upper Extremity:    Left Upper Extremity:      Deltoid  5/5   Deltoid  5/5   Biceps  5/5   Biceps  5/5   Triceps  5/5   Triceps  5/5   Wrist extensors  5/5   Wrist extensors  5/5   Wrist flexors  5/5   Wrist flexors  5/5   Finger extensors  5/5   Finger extensors  5/5   Finger flexors  5/5   Finger flexors  5/5   Dorsal interossei  5/5   Dorsal interossei  5/5   Abductor  pollicis  5/5   Abductor pollicis  5/5   Tone (Ashworth scale)  0  Tone (Ashworth scale)  0   Right Lower Extremity:    Left Lower Extremity:    Hip flexors  5/5   Hip flexors  5/5   Hip extensors  5/5   Hip extensors  5/5   Knee flexors  5/5   Knee flexors  5/5   Knee extensors  5/5   Knee extensors  5/5   Dorsiflexors  5/5   Dorsiflexors  5/5   Plantarflexors  5/5   Plantarflexors  5/5   Toe extensors  5/5   Toe extensors  5/5   Toe flexors  5/5   Toe flexors  5/5   Tone (Ashworth scale)  0  Tone (Ashworth scale)  0   MSRs:  Right                                                                 Left brachioradialis 2+  brachioradialis 2+  biceps 2+  biceps 2+  triceps 2+  triceps 2+  patellar 2+  patellar 2+  ankle jerk 1+  ankle jerk 1+  Hoffman no  Hoffman no  plantar response down  plantar response down   SENSORY:  Normal and symmetric perception of light touch, pinprick, vibration, and proprioception.  Romberg's sign absent.   COORDINATION/GAIT: Normal finger-to- nose-finger and heel-to-shin.  Intact rapid alternating movements bilaterally.  Able to rise from a chair without using arms.  Gait narrow based and stable. Tandem and stressed gait intact.    IMPRESSION: Mrs. Wurtz is a 71 year-old female referred for evaluation of bilateral lower leg achy and burning pain.  Pain is worse with prolonged standing and walking and improved by rest.  Symptoms are suggestive of claudication, but her vascular studies are normal.  She also complains of intermittent tingling of the legs.  Her exam only discloses reduced ankle reflexes, which can be normal finding  for patient's age; therefore, additional testing with NCS/EMG is necessary. Symptoms are atypical for neuropathy.  Lack of low back pain makes neurogenic claudication less likely. ?myalgia vs tendonitis as NSAIDs provide some relief.   PLAN/RECOMMENDATIONS:  1.  NCS/EMG of the legs 2.  Check ESR, CRP, CK, vitamin B12, TSH  Further recommendations will be based on the results of her testing.   The duration of this appointment visit was 45 minutes of face-to-face time with the patient.  Greater than 50% of this time was spent in counseling, explanation of diagnosis, planning of further management, and coordination of care.   Thank you for allowing me to participate in patient's care.  If I can answer any additional questions, I would be pleased to do so.    Sincerely,    Laura Caldas K. Posey Pronto, DO

## 2017-03-18 NOTE — Patient Instructions (Signed)
NCS/EMG of the legs on August 2nd at 9:30am. Please arrive 15 minutes prior to appointment.   Check labs  We will call you with the results and determine the next step

## 2017-03-19 ENCOUNTER — Telehealth: Payer: Self-pay | Admitting: *Deleted

## 2017-03-19 ENCOUNTER — Other Ambulatory Visit (INDEPENDENT_AMBULATORY_CARE_PROVIDER_SITE_OTHER): Payer: Medicare Other

## 2017-03-19 ENCOUNTER — Other Ambulatory Visit: Payer: Self-pay | Admitting: *Deleted

## 2017-03-19 DIAGNOSIS — R7989 Other specified abnormal findings of blood chemistry: Secondary | ICD-10-CM

## 2017-03-19 DIAGNOSIS — R946 Abnormal results of thyroid function studies: Secondary | ICD-10-CM | POA: Diagnosis not present

## 2017-03-19 LAB — T3, FREE: T3, Free: 3.2 pg/mL (ref 2.3–4.2)

## 2017-03-19 LAB — T4, FREE: Free T4: 0.73 ng/dL (ref 0.60–1.60)

## 2017-03-19 NOTE — Telephone Encounter (Signed)
-----   Message from Alda Berthold, DO sent at 03/18/2017  5:13 PM EDT ----- Labs are normal, except her thyroid is abnormal.  Let's check free T3 and free T4 to follow-up on her TSH. Thanks.

## 2017-03-19 NOTE — Telephone Encounter (Signed)
Left message for patient to call me back. 

## 2017-03-19 NOTE — Telephone Encounter (Signed)
Patient given results and will come by today for more lab work.

## 2017-03-23 ENCOUNTER — Telehealth: Payer: Self-pay | Admitting: *Deleted

## 2017-03-23 NOTE — Telephone Encounter (Signed)
-----   Message from Alda Berthold, DO sent at 03/23/2017  8:09 AM EDT ----- Please notify patient lab are within normal limits.  Thank you.

## 2017-03-23 NOTE — Telephone Encounter (Signed)
Left message informing patient that her labs returned normal.

## 2017-04-06 ENCOUNTER — Ambulatory Visit
Admission: RE | Admit: 2017-04-06 | Discharge: 2017-04-06 | Disposition: A | Discharge status: Home / Self Care | Payer: Medicare Other | Source: Ambulatory Visit | Attending: Obstetrics and Gynecology | Admitting: Obstetrics and Gynecology

## 2017-04-06 DIAGNOSIS — Z1231 Encounter for screening mammogram for malignant neoplasm of breast: Secondary | ICD-10-CM

## 2017-04-23 ENCOUNTER — Ambulatory Visit (INDEPENDENT_AMBULATORY_CARE_PROVIDER_SITE_OTHER): Payer: Medicare Other | Admitting: Neurology

## 2017-04-23 ENCOUNTER — Telehealth: Payer: Self-pay | Admitting: *Deleted

## 2017-04-23 DIAGNOSIS — M79604 Pain in right leg: Secondary | ICD-10-CM | POA: Diagnosis not present

## 2017-04-23 DIAGNOSIS — M79605 Pain in left leg: Secondary | ICD-10-CM | POA: Diagnosis not present

## 2017-04-23 DIAGNOSIS — M5417 Radiculopathy, lumbosacral region: Secondary | ICD-10-CM

## 2017-04-23 NOTE — Telephone Encounter (Signed)
-----   Message from Alda Berthold, DO sent at 04/23/2017 11:22 AM EDT ----- Please inform patient that her nerve testing shows possible nerve impingement stemming from her back which may be contributing to her pain.  Since she had no improvement with physical therapy, I recommend MRI of the lumbar spine as the next step to look for structural changes. If she is agreeable, please order.

## 2017-04-23 NOTE — Telephone Encounter (Signed)
Left message for patient to call me back. 

## 2017-04-23 NOTE — Procedures (Signed)
[facility]  [place], [place]  [place], [place] [address] Tel: [phone] Fax:  [phone] Test Date:  04/23/2017  Patient: Cynthia Travis DOB: May 26, 1946 Physician: Narda Amber, DO  Sex: Female Height: 5\' 3"  Ref Phys: Narda Amber, DO  ID#: [id number] Temp: 33.6C Technician:    Patient Complaints: This is a 71 year-old female referred for evaluation of bilateral achy and burning pain of the lower legs.  NCV & EMG Findings: Extensive electrodiagnostic testing of the right lower extremity and additional studies of the left shows:  1. Bilateral sural and superficial peroneal sensory responses are within normal limits. 2. Left peroneal motor response shows reduced amplitude at the extensor digitorum brevis, and is normal at the tibialis anterior. Bilateral tibial and right peroneal motor responses are within normal limits. 3. Bilateral tibial H reflex studies are absent. 4. Chronic motor axon loss changes are seen affecting bilateral S1 myotomes, without accompanied active denervation.  Impression: Chronic S1 radiculopathy affecting bilateral lower extremities, mild-to-moderate in degree electrically.   ___________________________ Narda Amber, DO    Nerve Conduction Studies Anti Sensory Summary Table   Site NR Peak (ms) Norm Peak (ms) P-T Amp (V) Norm P-T Amp  Left Sup Peroneal Anti Sensory (Ant Lat Mall)  12 cm    2.8 <4.6 6.2 >3  Right Sup Peroneal Anti Sensory (Ant Lat Mall)  12 cm    3.4 <4.6 4.8 >3  Left Sural Anti Sensory (Lat Mall)  Calf    4.3 <4.6 7.2 >3  Right Sural Anti Sensory (Lat Mall)  Calf    3.5 <4.6 7.7 >3   Motor Summary Table   Site NR Onset (ms) Norm Onset (ms) O-P Amp (mV) Norm O-P Amp Site1 Site2 Delta-0 (ms) Dist (cm) Vel (m/s) Norm Vel (m/s)  Left Peroneal Motor (Ext Dig Brev)  Ankle    4.9 <6.0 1.7 >2.5 B Fib Ankle 8.1 34.0 42 >40  B Fib    13.0  1.7  Poplt B Fib 1.3 10.0 77 >40  Poplt    14.3  1.7         Right Peroneal  Motor (Ext Dig Brev)  Ankle    3.8 <6.0 3.5 >2.5 B Fib Ankle 9.2 37.0 40 >40  B Fib    13.0  3.4  Poplt B Fib 1.8 8.0 44 >40  Poplt    14.8  3.1         Left Peroneal TA Motor (Tib Ant)  Fib Head    3.1 <4.5 7.0 >3 Poplit Fib Head 1.5 8.0 53 >40  Poplit    4.6  6.9         Left Tibial Motor (Abd Hall Brev)  Ankle    4.0 <6.0 7.0 >4 Knee Ankle 9.6 38.0 40 >40  Knee    13.6  5.4         Right Tibial Motor (Abd Hall Brev)  Ankle    4.7 <6.0 8.6 >4 Knee Ankle 9.1 36.0 40 >40  Knee    13.8  5.1          H Reflex Studies   NR H-Lat (ms) Lat Norm (ms) L-R H-Lat (ms)  Left Tibial (Gastroc)  NR  <35   Right Tibial (Gastroc)  NR  <35    EMG   Side Muscle Ins Act Fibs Psw Fasc Number Recrt Dur Dur. Amp Amp. Poly Poly. Comment  Right AntTibialis Nml Nml Nml Nml Nml Nml Nml Nml Nml Nml  Nml Nml N/A  Right Gastroc Nml Nml Nml Nml 2- Rapid Some 1+ Some 1+ Nml Nml N/A  Right Flex Dig Long Nml Nml Nml Nml Nml Nml Nml Nml Nml Nml Nml Nml N/A  Right RectFemoris Nml Nml Nml Nml Nml Nml Nml Nml Nml Nml Nml Nml N/A  Right GluteusMed Nml Nml Nml Nml Nml Nml Nml Nml Nml Nml Nml Nml N/A  Left AntTibialis Nml Nml Nml Nml Nml Nml Nml Nml Nml Nml Nml Nml N/A  Left Gastroc Nml Nml Nml Nml 2- Rapid Some 1+ Some 1+ Nml Nml N/A  Left Flex Dig Long Nml Nml Nml Nml Nml Nml Nml Nml Nml Nml Nml Nml N/A  Left RectFemoris Nml Nml Nml Nml Nml Nml Nml Nml Nml Nml Nml Nml N/A  Left GluteusMed Nml Nml Nml Nml Nml Nml Nml Nml Nml Nml Nml Nml N/A  Left BicepsFemS Nml Nml Nml Nml 1- Rapid Some 1+ Few 1+ Nml Nml N/A  Right BicepsFemS Nml Nml Nml Nml 1- Rapid Some 1+ Few 1+ Nml Nml N/A      Waveforms:

## 2017-04-24 ENCOUNTER — Telehealth: Payer: Self-pay | Admitting: *Deleted

## 2017-04-24 ENCOUNTER — Other Ambulatory Visit: Payer: Self-pay | Admitting: *Deleted

## 2017-04-24 ENCOUNTER — Telehealth: Payer: Self-pay | Admitting: Neurology

## 2017-04-24 DIAGNOSIS — M5416 Radiculopathy, lumbar region: Secondary | ICD-10-CM

## 2017-04-24 NOTE — Telephone Encounter (Signed)
Should this MRI be with, without or both?

## 2017-04-24 NOTE — Telephone Encounter (Signed)
-----   Message from Alda Berthold, DO sent at 04/23/2017 11:22 AM EDT ----- Please inform patient that her nerve testing shows possible nerve impingement stemming from her back which may be contributing to her pain.  Since she had no improvement with physical therapy, I recommend MRI of the lumbar spine as the next step to look for structural changes. If she is agreeable, please order.

## 2017-04-24 NOTE — Telephone Encounter (Signed)
Patient notified that [facility] will be calling her and instructed her to let them know.

## 2017-04-24 NOTE — Telephone Encounter (Signed)
Patient called needing to see if she can have an MRI because she has had a Hip replacement. Please call. Thanks

## 2017-04-24 NOTE — Telephone Encounter (Signed)
Patient notified and order placed. 

## 2017-04-24 NOTE — Telephone Encounter (Signed)
Patient notified

## 2017-04-24 NOTE — Telephone Encounter (Signed)
Order placed

## 2017-04-24 NOTE — Telephone Encounter (Signed)
MRI lumbar without contrast, thanks!

## 2017-05-03 ENCOUNTER — Other Ambulatory Visit: Payer: Medicare Other

## 2017-05-05 ENCOUNTER — Ambulatory Visit
Admission: RE | Admit: 2017-05-05 | Discharge: 2017-05-05 | Disposition: A | Discharge status: Home / Self Care | Payer: Medicare Other | Source: Ambulatory Visit | Attending: Neurology | Admitting: Neurology

## 2017-05-05 DIAGNOSIS — M5416 Radiculopathy, lumbar region: Secondary | ICD-10-CM

## 2017-05-05 DIAGNOSIS — M48061 Spinal stenosis, lumbar region without neurogenic claudication: Secondary | ICD-10-CM | POA: Diagnosis not present

## 2017-05-08 ENCOUNTER — Telehealth: Payer: Self-pay | Admitting: Neurology

## 2017-05-08 NOTE — Telephone Encounter (Signed)
Referral sent 

## 2017-05-08 NOTE — Telephone Encounter (Signed)
Called and informed patient about MRI lumbar spine results which shows spinal canal stenosis due to disc bulge at L4-5 and encroachment of bilateral L5 nerve roots. Recommend starting with physical therapy to see if low back strengthening can help with her neurogenic claudication.  If no improvement, we will seek the opinion of spine specialist.  Babacar Haycraft K. Posey Pronto, DO

## 2017-05-08 NOTE — Telephone Encounter (Signed)
PT called and wanted to know if her MRI results were available

## 2017-05-08 NOTE — Telephone Encounter (Signed)
Please advise 

## 2017-05-12 DIAGNOSIS — M6281 Muscle weakness (generalized): Secondary | ICD-10-CM | POA: Diagnosis not present

## 2017-05-12 DIAGNOSIS — M2560 Stiffness of unspecified joint, not elsewhere classified: Secondary | ICD-10-CM | POA: Diagnosis not present

## 2017-05-12 DIAGNOSIS — M5431 Sciatica, right side: Secondary | ICD-10-CM | POA: Diagnosis not present

## 2017-05-14 DIAGNOSIS — M2560 Stiffness of unspecified joint, not elsewhere classified: Secondary | ICD-10-CM | POA: Diagnosis not present

## 2017-05-14 DIAGNOSIS — M6281 Muscle weakness (generalized): Secondary | ICD-10-CM | POA: Diagnosis not present

## 2017-05-14 DIAGNOSIS — M5431 Sciatica, right side: Secondary | ICD-10-CM | POA: Diagnosis not present

## 2017-05-19 DIAGNOSIS — M6281 Muscle weakness (generalized): Secondary | ICD-10-CM | POA: Diagnosis not present

## 2017-05-19 DIAGNOSIS — M5431 Sciatica, right side: Secondary | ICD-10-CM | POA: Diagnosis not present

## 2017-05-19 DIAGNOSIS — M2560 Stiffness of unspecified joint, not elsewhere classified: Secondary | ICD-10-CM | POA: Diagnosis not present

## 2017-05-21 DIAGNOSIS — M6281 Muscle weakness (generalized): Secondary | ICD-10-CM | POA: Diagnosis not present

## 2017-05-21 DIAGNOSIS — M5431 Sciatica, right side: Secondary | ICD-10-CM | POA: Diagnosis not present

## 2017-05-21 DIAGNOSIS — M2560 Stiffness of unspecified joint, not elsewhere classified: Secondary | ICD-10-CM | POA: Diagnosis not present

## 2017-05-26 DIAGNOSIS — M5431 Sciatica, right side: Secondary | ICD-10-CM | POA: Diagnosis not present

## 2017-05-26 DIAGNOSIS — M6281 Muscle weakness (generalized): Secondary | ICD-10-CM | POA: Diagnosis not present

## 2017-05-26 DIAGNOSIS — M2560 Stiffness of unspecified joint, not elsewhere classified: Secondary | ICD-10-CM | POA: Diagnosis not present

## 2017-05-28 DIAGNOSIS — M6281 Muscle weakness (generalized): Secondary | ICD-10-CM | POA: Diagnosis not present

## 2017-05-28 DIAGNOSIS — M5431 Sciatica, right side: Secondary | ICD-10-CM | POA: Diagnosis not present

## 2017-05-28 DIAGNOSIS — M2560 Stiffness of unspecified joint, not elsewhere classified: Secondary | ICD-10-CM | POA: Diagnosis not present

## 2017-06-02 DIAGNOSIS — M5431 Sciatica, right side: Secondary | ICD-10-CM | POA: Diagnosis not present

## 2017-06-02 DIAGNOSIS — M6281 Muscle weakness (generalized): Secondary | ICD-10-CM | POA: Diagnosis not present

## 2017-06-02 DIAGNOSIS — M2560 Stiffness of unspecified joint, not elsewhere classified: Secondary | ICD-10-CM | POA: Diagnosis not present

## 2017-06-04 DIAGNOSIS — M6281 Muscle weakness (generalized): Secondary | ICD-10-CM | POA: Diagnosis not present

## 2017-06-04 DIAGNOSIS — M2560 Stiffness of unspecified joint, not elsewhere classified: Secondary | ICD-10-CM | POA: Diagnosis not present

## 2017-06-04 DIAGNOSIS — M5431 Sciatica, right side: Secondary | ICD-10-CM | POA: Diagnosis not present

## 2017-06-05 DIAGNOSIS — Z23 Encounter for immunization: Secondary | ICD-10-CM | POA: Diagnosis not present

## 2017-06-09 DIAGNOSIS — M2560 Stiffness of unspecified joint, not elsewhere classified: Secondary | ICD-10-CM | POA: Diagnosis not present

## 2017-06-09 DIAGNOSIS — M6281 Muscle weakness (generalized): Secondary | ICD-10-CM | POA: Diagnosis not present

## 2017-06-09 DIAGNOSIS — M5431 Sciatica, right side: Secondary | ICD-10-CM | POA: Diagnosis not present

## 2017-06-16 DIAGNOSIS — M6281 Muscle weakness (generalized): Secondary | ICD-10-CM | POA: Diagnosis not present

## 2017-06-16 DIAGNOSIS — M5431 Sciatica, right side: Secondary | ICD-10-CM | POA: Diagnosis not present

## 2017-06-16 DIAGNOSIS — M2560 Stiffness of unspecified joint, not elsewhere classified: Secondary | ICD-10-CM | POA: Diagnosis not present

## 2017-06-25 DIAGNOSIS — M2560 Stiffness of unspecified joint, not elsewhere classified: Secondary | ICD-10-CM | POA: Diagnosis not present

## 2017-06-25 DIAGNOSIS — M6281 Muscle weakness (generalized): Secondary | ICD-10-CM | POA: Diagnosis not present

## 2017-06-25 DIAGNOSIS — M5431 Sciatica, right side: Secondary | ICD-10-CM | POA: Diagnosis not present

## 2017-06-30 DIAGNOSIS — M2560 Stiffness of unspecified joint, not elsewhere classified: Secondary | ICD-10-CM | POA: Diagnosis not present

## 2017-06-30 DIAGNOSIS — M5431 Sciatica, right side: Secondary | ICD-10-CM | POA: Diagnosis not present

## 2017-06-30 DIAGNOSIS — M6281 Muscle weakness (generalized): Secondary | ICD-10-CM | POA: Diagnosis not present

## 2017-07-14 DIAGNOSIS — M6281 Muscle weakness (generalized): Secondary | ICD-10-CM | POA: Diagnosis not present

## 2017-07-14 DIAGNOSIS — M2560 Stiffness of unspecified joint, not elsewhere classified: Secondary | ICD-10-CM | POA: Diagnosis not present

## 2017-07-14 DIAGNOSIS — M5431 Sciatica, right side: Secondary | ICD-10-CM | POA: Diagnosis not present

## 2017-07-23 DIAGNOSIS — H40013 Open angle with borderline findings, low risk, bilateral: Secondary | ICD-10-CM | POA: Diagnosis not present

## 2017-07-23 DIAGNOSIS — H524 Presbyopia: Secondary | ICD-10-CM | POA: Diagnosis not present

## 2017-07-23 DIAGNOSIS — H25813 Combined forms of age-related cataract, bilateral: Secondary | ICD-10-CM | POA: Diagnosis not present

## 2017-08-18 ENCOUNTER — Telehealth: Payer: Self-pay | Admitting: Neurology

## 2017-08-18 NOTE — Telephone Encounter (Signed)
Please send referral to neurosurgery for lumbar canal stenosis.

## 2017-08-18 NOTE — Telephone Encounter (Signed)
I spoke with patient.  She has had PT and it did not help.  Last note says to refer to spine specialist if PT does not work. Ok to refer to [facility]?  Any particular doctor?

## 2017-08-18 NOTE — Telephone Encounter (Signed)
Records and referral sent.

## 2017-08-18 NOTE — Telephone Encounter (Signed)
Left message for patient to call me back. 

## 2017-08-18 NOTE — Telephone Encounter (Signed)
Pt called and left a message asking for a call back regarding physical therapy referral

## 2017-09-09 ENCOUNTER — Telehealth: Payer: Self-pay | Admitting: Neurology

## 2017-09-09 NOTE — Telephone Encounter (Signed)
Patient lmom to have you call her. She did not say why. Thanks

## 2017-09-10 ENCOUNTER — Telehealth: Payer: Self-pay | Admitting: Neurology

## 2017-09-10 NOTE — Telephone Encounter (Signed)
Pt called and said Dr Posey Pronto was referring her to a spine specialist and she has not heard anything yet and was told to call if she had not heard anything by now

## 2017-09-10 NOTE — Telephone Encounter (Signed)
Called patient and gave her the number to [facility] for patient to call and check on status of appt.

## 2017-09-24 DIAGNOSIS — M48062 Spinal stenosis, lumbar region with neurogenic claudication: Secondary | ICD-10-CM | POA: Diagnosis not present

## 2017-09-24 DIAGNOSIS — Z6828 Body mass index (BMI) 28.0-28.9, adult: Secondary | ICD-10-CM | POA: Diagnosis not present

## 2017-09-24 DIAGNOSIS — R030 Elevated blood-pressure reading, without diagnosis of hypertension: Secondary | ICD-10-CM | POA: Diagnosis not present

## 2017-10-21 DIAGNOSIS — M48062 Spinal stenosis, lumbar region with neurogenic claudication: Secondary | ICD-10-CM | POA: Diagnosis not present

## 2017-11-05 DIAGNOSIS — H524 Presbyopia: Secondary | ICD-10-CM | POA: Diagnosis not present

## 2017-11-05 DIAGNOSIS — H25813 Combined forms of age-related cataract, bilateral: Secondary | ICD-10-CM | POA: Diagnosis not present

## 2017-11-09 DIAGNOSIS — Z131 Encounter for screening for diabetes mellitus: Secondary | ICD-10-CM | POA: Diagnosis not present

## 2017-11-09 DIAGNOSIS — Z1389 Encounter for screening for other disorder: Secondary | ICD-10-CM | POA: Diagnosis not present

## 2017-11-09 DIAGNOSIS — Z1211 Encounter for screening for malignant neoplasm of colon: Secondary | ICD-10-CM | POA: Diagnosis not present

## 2017-11-09 DIAGNOSIS — Z87898 Personal history of other specified conditions: Secondary | ICD-10-CM | POA: Diagnosis not present

## 2017-11-09 DIAGNOSIS — Z0000 Encounter for general adult medical examination without abnormal findings: Secondary | ICD-10-CM | POA: Diagnosis not present

## 2017-11-09 DIAGNOSIS — Z23 Encounter for immunization: Secondary | ICD-10-CM | POA: Diagnosis not present

## 2018-02-04 DIAGNOSIS — H35373 Puckering of macula, bilateral: Secondary | ICD-10-CM | POA: Diagnosis not present

## 2018-02-04 DIAGNOSIS — H25813 Combined forms of age-related cataract, bilateral: Secondary | ICD-10-CM | POA: Diagnosis not present

## 2018-02-04 DIAGNOSIS — H524 Presbyopia: Secondary | ICD-10-CM | POA: Diagnosis not present

## 2018-02-04 DIAGNOSIS — H40013 Open angle with borderline findings, low risk, bilateral: Secondary | ICD-10-CM | POA: Diagnosis not present

## 2018-03-11 ENCOUNTER — Other Ambulatory Visit: Payer: Self-pay | Admitting: Obstetrics and Gynecology

## 2018-03-11 DIAGNOSIS — J0100 Acute maxillary sinusitis, unspecified: Secondary | ICD-10-CM | POA: Diagnosis not present

## 2018-03-11 DIAGNOSIS — Z1231 Encounter for screening mammogram for malignant neoplasm of breast: Secondary | ICD-10-CM

## 2018-03-11 DIAGNOSIS — H6691 Otitis media, unspecified, right ear: Secondary | ICD-10-CM | POA: Diagnosis not present

## 2018-04-08 ENCOUNTER — Ambulatory Visit
Admission: RE | Admit: 2018-04-08 | Discharge: 2018-04-08 | Disposition: A | Discharge status: Home / Self Care | Payer: Medicare Other | Source: Ambulatory Visit | Attending: Obstetrics and Gynecology | Admitting: Obstetrics and Gynecology

## 2018-04-08 DIAGNOSIS — Z1231 Encounter for screening mammogram for malignant neoplasm of breast: Secondary | ICD-10-CM | POA: Diagnosis not present

## 2018-05-12 DIAGNOSIS — Z124 Encounter for screening for malignant neoplasm of cervix: Secondary | ICD-10-CM | POA: Diagnosis not present

## 2018-05-13 DIAGNOSIS — H35373 Puckering of macula, bilateral: Secondary | ICD-10-CM | POA: Diagnosis not present

## 2018-05-13 DIAGNOSIS — H40013 Open angle with borderline findings, low risk, bilateral: Secondary | ICD-10-CM | POA: Diagnosis not present

## 2018-05-13 DIAGNOSIS — H25813 Combined forms of age-related cataract, bilateral: Secondary | ICD-10-CM | POA: Diagnosis not present

## 2018-06-16 DIAGNOSIS — Z23 Encounter for immunization: Secondary | ICD-10-CM | POA: Diagnosis not present

## 2018-09-27 DIAGNOSIS — J0100 Acute maxillary sinusitis, unspecified: Secondary | ICD-10-CM | POA: Diagnosis not present

## 2018-11-10 DIAGNOSIS — Z1211 Encounter for screening for malignant neoplasm of colon: Secondary | ICD-10-CM | POA: Diagnosis not present

## 2018-11-10 DIAGNOSIS — Z87898 Personal history of other specified conditions: Secondary | ICD-10-CM | POA: Diagnosis not present

## 2018-11-10 DIAGNOSIS — Z0000 Encounter for general adult medical examination without abnormal findings: Secondary | ICD-10-CM | POA: Diagnosis not present

## 2018-11-10 DIAGNOSIS — R1011 Right upper quadrant pain: Secondary | ICD-10-CM | POA: Diagnosis not present

## 2018-11-10 DIAGNOSIS — F17210 Nicotine dependence, cigarettes, uncomplicated: Secondary | ICD-10-CM | POA: Diagnosis not present

## 2018-11-10 DIAGNOSIS — Z1389 Encounter for screening for other disorder: Secondary | ICD-10-CM | POA: Diagnosis not present

## 2018-11-10 DIAGNOSIS — Z131 Encounter for screening for diabetes mellitus: Secondary | ICD-10-CM | POA: Diagnosis not present

## 2018-11-15 DIAGNOSIS — F17210 Nicotine dependence, cigarettes, uncomplicated: Secondary | ICD-10-CM | POA: Diagnosis not present

## 2018-11-15 DIAGNOSIS — R1011 Right upper quadrant pain: Secondary | ICD-10-CM | POA: Diagnosis not present

## 2018-11-16 ENCOUNTER — Other Ambulatory Visit: Payer: Self-pay | Admitting: Family Medicine

## 2018-11-16 DIAGNOSIS — R1011 Right upper quadrant pain: Secondary | ICD-10-CM

## 2018-11-18 ENCOUNTER — Ambulatory Visit
Admission: RE | Admit: 2018-11-18 | Discharge: 2018-11-18 | Disposition: A | Discharge status: Home / Self Care | Payer: Medicare Other | Source: Ambulatory Visit | Attending: Family Medicine | Admitting: Family Medicine

## 2018-11-18 DIAGNOSIS — H25813 Combined forms of age-related cataract, bilateral: Secondary | ICD-10-CM | POA: Diagnosis not present

## 2018-11-18 DIAGNOSIS — R1011 Right upper quadrant pain: Secondary | ICD-10-CM | POA: Diagnosis not present

## 2018-11-18 DIAGNOSIS — H524 Presbyopia: Secondary | ICD-10-CM | POA: Diagnosis not present

## 2018-11-18 DIAGNOSIS — H40013 Open angle with borderline findings, low risk, bilateral: Secondary | ICD-10-CM | POA: Diagnosis not present

## 2018-11-23 ENCOUNTER — Other Ambulatory Visit (HOSPITAL_COMMUNITY): Payer: Self-pay | Admitting: Family Medicine

## 2018-11-23 ENCOUNTER — Other Ambulatory Visit: Payer: Self-pay | Admitting: Family Medicine

## 2018-11-23 DIAGNOSIS — R110 Nausea: Secondary | ICD-10-CM

## 2018-11-23 DIAGNOSIS — R1011 Right upper quadrant pain: Secondary | ICD-10-CM

## 2018-12-02 ENCOUNTER — Other Ambulatory Visit: Payer: Self-pay

## 2018-12-02 ENCOUNTER — Ambulatory Visit (HOSPITAL_COMMUNITY)
Admission: RE | Admit: 2018-12-02 | Discharge: 2018-12-02 | Disposition: A | Discharge status: Home / Self Care | Payer: Medicare Other | Source: Ambulatory Visit | Attending: Family Medicine | Admitting: Family Medicine

## 2018-12-02 DIAGNOSIS — R110 Nausea: Secondary | ICD-10-CM | POA: Insufficient documentation

## 2018-12-02 DIAGNOSIS — R1011 Right upper quadrant pain: Secondary | ICD-10-CM

## 2018-12-02 MED ORDER — TECHNETIUM TC 99M MEBROFENIN IV KIT
5.0000 | PACK | Freq: Once | INTRAVENOUS | Status: AC
  Administered 2018-12-02: 5 via INTRAVENOUS

## 2018-12-06 DIAGNOSIS — H2511 Age-related nuclear cataract, right eye: Secondary | ICD-10-CM | POA: Diagnosis not present

## 2018-12-06 DIAGNOSIS — H2513 Age-related nuclear cataract, bilateral: Secondary | ICD-10-CM | POA: Diagnosis not present

## 2019-01-14 DIAGNOSIS — F17210 Nicotine dependence, cigarettes, uncomplicated: Secondary | ICD-10-CM | POA: Diagnosis not present

## 2019-01-14 DIAGNOSIS — J0100 Acute maxillary sinusitis, unspecified: Secondary | ICD-10-CM | POA: Diagnosis not present

## 2019-02-23 DIAGNOSIS — Z8719 Personal history of other diseases of the digestive system: Secondary | ICD-10-CM | POA: Diagnosis not present

## 2019-02-23 DIAGNOSIS — Z9049 Acquired absence of other specified parts of digestive tract: Secondary | ICD-10-CM | POA: Diagnosis not present

## 2019-02-23 DIAGNOSIS — R1011 Right upper quadrant pain: Secondary | ICD-10-CM | POA: Diagnosis not present

## 2019-02-23 DIAGNOSIS — Z800 Family history of malignant neoplasm of digestive organs: Secondary | ICD-10-CM | POA: Diagnosis not present

## 2019-02-23 DIAGNOSIS — K219 Gastro-esophageal reflux disease without esophagitis: Secondary | ICD-10-CM | POA: Diagnosis not present

## 2019-02-23 DIAGNOSIS — K760 Fatty (change of) liver, not elsewhere classified: Secondary | ICD-10-CM | POA: Diagnosis not present

## 2019-03-02 ENCOUNTER — Other Ambulatory Visit: Payer: Self-pay | Admitting: Obstetrics and Gynecology

## 2019-03-02 DIAGNOSIS — Z1231 Encounter for screening mammogram for malignant neoplasm of breast: Secondary | ICD-10-CM

## 2019-03-14 DIAGNOSIS — H25811 Combined forms of age-related cataract, right eye: Secondary | ICD-10-CM | POA: Diagnosis not present

## 2019-03-14 DIAGNOSIS — H2511 Age-related nuclear cataract, right eye: Secondary | ICD-10-CM | POA: Diagnosis not present

## 2019-03-21 DIAGNOSIS — H2512 Age-related nuclear cataract, left eye: Secondary | ICD-10-CM | POA: Diagnosis not present

## 2019-04-04 DIAGNOSIS — H2512 Age-related nuclear cataract, left eye: Secondary | ICD-10-CM | POA: Diagnosis not present

## 2019-04-04 DIAGNOSIS — H25812 Combined forms of age-related cataract, left eye: Secondary | ICD-10-CM | POA: Diagnosis not present

## 2019-04-20 ENCOUNTER — Other Ambulatory Visit: Payer: Self-pay

## 2019-04-20 ENCOUNTER — Ambulatory Visit
Admission: RE | Admit: 2019-04-20 | Discharge: 2019-04-20 | Disposition: A | Discharge status: Home / Self Care | Payer: Medicare Other | Source: Ambulatory Visit | Attending: Obstetrics and Gynecology | Admitting: Obstetrics and Gynecology

## 2019-04-20 DIAGNOSIS — Z1231 Encounter for screening mammogram for malignant neoplasm of breast: Secondary | ICD-10-CM | POA: Diagnosis not present

## 2019-04-28 DIAGNOSIS — J019 Acute sinusitis, unspecified: Secondary | ICD-10-CM | POA: Diagnosis not present

## 2019-05-14 DIAGNOSIS — Z23 Encounter for immunization: Secondary | ICD-10-CM | POA: Diagnosis not present

## 2019-05-16 DIAGNOSIS — Z1159 Encounter for screening for other viral diseases: Secondary | ICD-10-CM | POA: Diagnosis not present

## 2019-05-19 DIAGNOSIS — K2930 Chronic superficial gastritis without bleeding: Secondary | ICD-10-CM | POA: Diagnosis not present

## 2019-05-19 DIAGNOSIS — K2980 Duodenitis without bleeding: Secondary | ICD-10-CM | POA: Diagnosis not present

## 2019-05-19 DIAGNOSIS — K210 Gastro-esophageal reflux disease with esophagitis: Secondary | ICD-10-CM | POA: Diagnosis not present

## 2019-05-19 DIAGNOSIS — R12 Heartburn: Secondary | ICD-10-CM | POA: Diagnosis not present

## 2019-05-19 DIAGNOSIS — Z800 Family history of malignant neoplasm of digestive organs: Secondary | ICD-10-CM | POA: Diagnosis not present

## 2019-05-19 DIAGNOSIS — K228 Other specified diseases of esophagus: Secondary | ICD-10-CM | POA: Diagnosis not present

## 2019-05-19 DIAGNOSIS — K3189 Other diseases of stomach and duodenum: Secondary | ICD-10-CM | POA: Diagnosis not present

## 2019-05-25 DIAGNOSIS — K2930 Chronic superficial gastritis without bleeding: Secondary | ICD-10-CM | POA: Diagnosis not present

## 2019-05-25 DIAGNOSIS — K210 Gastro-esophageal reflux disease with esophagitis: Secondary | ICD-10-CM | POA: Diagnosis not present

## 2019-05-25 DIAGNOSIS — K2980 Duodenitis without bleeding: Secondary | ICD-10-CM | POA: Diagnosis not present

## 2019-08-25 DIAGNOSIS — K219 Gastro-esophageal reflux disease without esophagitis: Secondary | ICD-10-CM | POA: Diagnosis not present

## 2019-08-25 DIAGNOSIS — K760 Fatty (change of) liver, not elsewhere classified: Secondary | ICD-10-CM | POA: Diagnosis not present

## 2019-08-25 DIAGNOSIS — Z9049 Acquired absence of other specified parts of digestive tract: Secondary | ICD-10-CM | POA: Diagnosis not present

## 2019-08-25 DIAGNOSIS — Z800 Family history of malignant neoplasm of digestive organs: Secondary | ICD-10-CM | POA: Diagnosis not present

## 2019-08-25 DIAGNOSIS — R1011 Right upper quadrant pain: Secondary | ICD-10-CM | POA: Diagnosis not present

## 2019-08-29 ENCOUNTER — Other Ambulatory Visit: Payer: Self-pay | Admitting: Gastroenterology

## 2019-08-29 DIAGNOSIS — R1011 Right upper quadrant pain: Secondary | ICD-10-CM

## 2019-09-06 ENCOUNTER — Ambulatory Visit
Admission: RE | Admit: 2019-09-06 | Discharge: 2019-09-06 | Disposition: A | Discharge status: Home / Self Care | Payer: Medicare Other | Source: Ambulatory Visit | Attending: Gastroenterology | Admitting: Gastroenterology

## 2019-09-06 DIAGNOSIS — R1011 Right upper quadrant pain: Secondary | ICD-10-CM

## 2019-09-06 MED ORDER — IOPAMIDOL (ISOVUE-300) INJECTION 61%
100.0000 mL | Freq: Once | INTRAVENOUS | Status: AC | PRN
  Administered 2019-09-06: 12:00:00 100 mL via INTRAVENOUS

## 2019-10-14 IMAGING — NM NUCLEAR MEDICINE HEPATOBILIARY IMAGING WITH GALLBLADDER EF
2 series · 12 of 12 positions shown · non-contrast
Comparison: None.

CLINICAL DATA: Right upper quadrant pain and nausea

EXAM:
NUCLEAR MEDICINE HEPATOBILIARY IMAGING WITH GALLBLADDER EF
VIEWS:
Anterior right upper quadrant
RADIOPHARMACEUTICALS:  5.0 mCi 9c-88m  Choletec IV

[Series 1: raw data · 4.46mm/px · 6 of 60 frames shown (1 of 2)]
[frame 6/60]
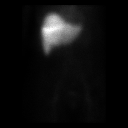
[frame 16/60]
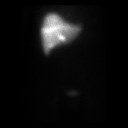
[frame 26/60]
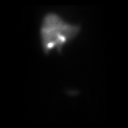
[frame 36/60]
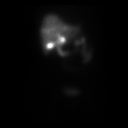
[frame 46/60]
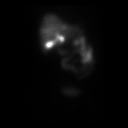
[frame 56/60]
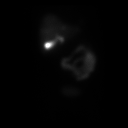

[Series 1: raw data · 4.46mm/px · 6 of 60 frames shown (2 of 2)]
[frame 6/60]
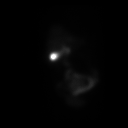
[frame 16/60]
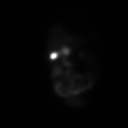
[frame 26/60]
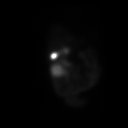
[frame 36/60]
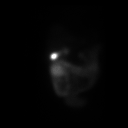
[frame 46/60]
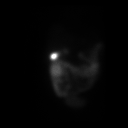
[frame 56/60]
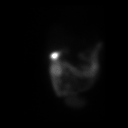

[12 of 12 positions shown; findings below may reference images not displayed]

FINDINGS: Liver uptake of radiotracer is unremarkable. There is prompt
visualization of gallbladder and small bowel, indicating patency of
the cystic and common bile ducts. The patient consumed 8 ounces of
Ensure orally with calculation of the computer generated ejection
fraction of radiotracer from the gallbladder. The patient did not
experience clinical symptoms with the oral Ensure consumption. The
computer generated ejection fraction of radiotracer from the
gallbladder is normal at 41%, normal greater than 33% using the oral
agent.
IMPRESSION: Study within normal limits.

## 2019-10-15 ENCOUNTER — Ambulatory Visit: Payer: Medicare Other | Attending: Internal Medicine

## 2019-10-15 DIAGNOSIS — Z23 Encounter for immunization: Secondary | ICD-10-CM | POA: Insufficient documentation

## 2019-10-15 NOTE — Progress Notes (Signed)
   Covid-19 Vaccination Clinic  Name:  Cynthia Travis    MRN: [id number] DOB: [birth year]  10/15/2019  [name] was observed post Covid-19 immunization for 15 minutes without incidence. She was provided with Vaccine Information Sheet and instruction to access the V-Safe system.   [name] was instructed to call 911 with any severe reactions post vaccine: Marland Kitchen Difficulty breathing  . Swelling of your face and throat  . A fast heartbeat  . A bad rash all over your body  . Dizziness and weakness    Immunizations Administered    Name Date Dose VIS Date Route   Pfizer COVID-19 Vaccine 10/15/2019  3:23 PM 0.3 mL 09/02/2019 Intramuscular   Manufacturer: [place]   Lot: [id number]   [place]: [id number]

## 2019-11-06 ENCOUNTER — Ambulatory Visit: Payer: Medicare Other | Attending: Internal Medicine

## 2019-11-06 DIAGNOSIS — Z23 Encounter for immunization: Secondary | ICD-10-CM | POA: Insufficient documentation

## 2019-11-06 NOTE — Progress Notes (Signed)
   Covid-19 Vaccination Clinic  Name:  Cynthia Travis    MRN: [id number] DOB: 17-Jan-1946  11/06/2019  [name] was observed post Covid-19 immunization for 15 minutes without incidence. She was provided with Vaccine Information Sheet and instruction to access the V-Safe system.   [name] was instructed to call 911 with any severe reactions post vaccine: Marland Kitchen Difficulty breathing  . Swelling of your face and throat  . A fast heartbeat  . A bad rash all over your body  . Dizziness and weakness    Immunizations Administered    Name Date Dose VIS Date Route   Pfizer COVID-19 Vaccine 11/06/2019 11:36 AM 0.3 mL 09/02/2019 Intramuscular   Manufacturer: [place]   Lot: [id number]   [place]: [id number]

## 2019-11-17 DIAGNOSIS — H524 Presbyopia: Secondary | ICD-10-CM | POA: Diagnosis not present

## 2019-11-17 DIAGNOSIS — H40013 Open angle with borderline findings, low risk, bilateral: Secondary | ICD-10-CM | POA: Diagnosis not present

## 2019-11-17 DIAGNOSIS — Z961 Presence of intraocular lens: Secondary | ICD-10-CM | POA: Diagnosis not present

## 2019-11-22 DIAGNOSIS — Z0000 Encounter for general adult medical examination without abnormal findings: Secondary | ICD-10-CM | POA: Diagnosis not present

## 2019-11-22 DIAGNOSIS — Z87898 Personal history of other specified conditions: Secondary | ICD-10-CM | POA: Diagnosis not present

## 2019-11-22 DIAGNOSIS — F17210 Nicotine dependence, cigarettes, uncomplicated: Secondary | ICD-10-CM | POA: Diagnosis not present

## 2019-11-22 DIAGNOSIS — Z1389 Encounter for screening for other disorder: Secondary | ICD-10-CM | POA: Diagnosis not present

## 2019-11-22 DIAGNOSIS — Z131 Encounter for screening for diabetes mellitus: Secondary | ICD-10-CM | POA: Diagnosis not present

## 2019-11-22 DIAGNOSIS — Z1159 Encounter for screening for other viral diseases: Secondary | ICD-10-CM | POA: Diagnosis not present

## 2019-11-22 DIAGNOSIS — K219 Gastro-esophageal reflux disease without esophagitis: Secondary | ICD-10-CM | POA: Diagnosis not present

## 2019-11-22 DIAGNOSIS — Z1211 Encounter for screening for malignant neoplasm of colon: Secondary | ICD-10-CM | POA: Diagnosis not present

## 2019-11-27 DIAGNOSIS — Z1211 Encounter for screening for malignant neoplasm of colon: Secondary | ICD-10-CM | POA: Diagnosis not present

## 2019-12-27 DIAGNOSIS — M25562 Pain in left knee: Secondary | ICD-10-CM | POA: Diagnosis not present

## 2020-02-23 DIAGNOSIS — M25562 Pain in left knee: Secondary | ICD-10-CM | POA: Diagnosis not present

## 2020-03-01 ENCOUNTER — Other Ambulatory Visit: Payer: Self-pay | Admitting: Gastroenterology

## 2020-03-01 DIAGNOSIS — R935 Abnormal findings on diagnostic imaging of other abdominal regions, including retroperitoneum: Secondary | ICD-10-CM

## 2020-03-07 ENCOUNTER — Ambulatory Visit
Admission: RE | Admit: 2020-03-07 | Discharge: 2020-03-07 | Disposition: A | Discharge status: Home / Self Care | Payer: Medicare Other | Source: Ambulatory Visit | Attending: Gastroenterology | Admitting: Gastroenterology

## 2020-03-07 DIAGNOSIS — R935 Abnormal findings on diagnostic imaging of other abdominal regions, including retroperitoneum: Secondary | ICD-10-CM

## 2020-03-30 ENCOUNTER — Other Ambulatory Visit: Payer: Self-pay | Admitting: Family Medicine

## 2020-03-30 DIAGNOSIS — Z1231 Encounter for screening mammogram for malignant neoplasm of breast: Secondary | ICD-10-CM

## 2020-04-27 ENCOUNTER — Other Ambulatory Visit: Payer: Self-pay

## 2020-04-27 ENCOUNTER — Ambulatory Visit
Admission: RE | Admit: 2020-04-27 | Discharge: 2020-04-27 | Disposition: A | Discharge status: Home / Self Care | Payer: Medicare Other | Source: Ambulatory Visit | Attending: Family Medicine | Admitting: Family Medicine

## 2020-04-27 DIAGNOSIS — Z1231 Encounter for screening mammogram for malignant neoplasm of breast: Secondary | ICD-10-CM | POA: Diagnosis not present

## 2020-05-22 DIAGNOSIS — Z23 Encounter for immunization: Secondary | ICD-10-CM | POA: Diagnosis not present

## 2020-06-19 DIAGNOSIS — Z23 Encounter for immunization: Secondary | ICD-10-CM | POA: Diagnosis not present

## 2020-08-02 DIAGNOSIS — Z1152 Encounter for screening for COVID-19: Secondary | ICD-10-CM | POA: Diagnosis not present

## 2020-11-28 DIAGNOSIS — R7301 Impaired fasting glucose: Secondary | ICD-10-CM | POA: Diagnosis not present

## 2020-11-28 DIAGNOSIS — Z0000 Encounter for general adult medical examination without abnormal findings: Secondary | ICD-10-CM | POA: Diagnosis not present

## 2020-11-28 DIAGNOSIS — K219 Gastro-esophageal reflux disease without esophagitis: Secondary | ICD-10-CM | POA: Diagnosis not present

## 2020-11-28 DIAGNOSIS — Z8349 Family history of other endocrine, nutritional and metabolic diseases: Secondary | ICD-10-CM | POA: Diagnosis not present

## 2020-11-28 DIAGNOSIS — I700 Atherosclerosis of aorta: Secondary | ICD-10-CM | POA: Diagnosis not present

## 2020-11-28 DIAGNOSIS — F17210 Nicotine dependence, cigarettes, uncomplicated: Secondary | ICD-10-CM | POA: Diagnosis not present

## 2020-11-28 DIAGNOSIS — Z1389 Encounter for screening for other disorder: Secondary | ICD-10-CM | POA: Diagnosis not present

## 2020-11-28 DIAGNOSIS — Z87898 Personal history of other specified conditions: Secondary | ICD-10-CM | POA: Diagnosis not present

## 2020-11-28 DIAGNOSIS — Z1211 Encounter for screening for malignant neoplasm of colon: Secondary | ICD-10-CM | POA: Diagnosis not present

## 2020-11-30 ENCOUNTER — Other Ambulatory Visit: Payer: Self-pay | Admitting: Family Medicine

## 2020-11-30 DIAGNOSIS — E2839 Other primary ovarian failure: Secondary | ICD-10-CM

## 2020-12-03 DIAGNOSIS — Z1211 Encounter for screening for malignant neoplasm of colon: Secondary | ICD-10-CM | POA: Diagnosis not present

## 2020-12-18 DIAGNOSIS — H04123 Dry eye syndrome of bilateral lacrimal glands: Secondary | ICD-10-CM | POA: Diagnosis not present

## 2020-12-18 DIAGNOSIS — H40013 Open angle with borderline findings, low risk, bilateral: Secondary | ICD-10-CM | POA: Diagnosis not present

## 2020-12-18 DIAGNOSIS — H524 Presbyopia: Secondary | ICD-10-CM | POA: Diagnosis not present

## 2020-12-18 DIAGNOSIS — H26493 Other secondary cataract, bilateral: Secondary | ICD-10-CM | POA: Diagnosis not present

## 2020-12-18 DIAGNOSIS — H35372 Puckering of macula, left eye: Secondary | ICD-10-CM | POA: Diagnosis not present

## 2020-12-18 DIAGNOSIS — H52223 Regular astigmatism, bilateral: Secondary | ICD-10-CM | POA: Diagnosis not present

## 2020-12-19 ENCOUNTER — Other Ambulatory Visit: Payer: Self-pay

## 2020-12-19 ENCOUNTER — Ambulatory Visit
Admission: RE | Admit: 2020-12-19 | Discharge: 2020-12-19 | Disposition: A | Discharge status: Home / Self Care | Payer: Medicare Other | Source: Ambulatory Visit | Attending: Family Medicine | Admitting: Family Medicine

## 2020-12-19 DIAGNOSIS — E2839 Other primary ovarian failure: Secondary | ICD-10-CM

## 2020-12-19 DIAGNOSIS — M810 Age-related osteoporosis without current pathological fracture: Secondary | ICD-10-CM | POA: Diagnosis not present

## 2020-12-19 DIAGNOSIS — M85851 Other specified disorders of bone density and structure, right thigh: Secondary | ICD-10-CM | POA: Diagnosis not present

## 2020-12-19 DIAGNOSIS — Z780 Asymptomatic menopausal state: Secondary | ICD-10-CM | POA: Diagnosis not present

## 2020-12-20 DIAGNOSIS — Z800 Family history of malignant neoplasm of digestive organs: Secondary | ICD-10-CM | POA: Diagnosis not present

## 2020-12-20 DIAGNOSIS — K760 Fatty (change of) liver, not elsewhere classified: Secondary | ICD-10-CM | POA: Diagnosis not present

## 2020-12-20 DIAGNOSIS — K219 Gastro-esophageal reflux disease without esophagitis: Secondary | ICD-10-CM | POA: Diagnosis not present

## 2020-12-20 DIAGNOSIS — Z9049 Acquired absence of other specified parts of digestive tract: Secondary | ICD-10-CM | POA: Diagnosis not present

## 2020-12-21 DIAGNOSIS — H04123 Dry eye syndrome of bilateral lacrimal glands: Secondary | ICD-10-CM | POA: Diagnosis not present

## 2020-12-21 DIAGNOSIS — H5789 Other specified disorders of eye and adnexa: Secondary | ICD-10-CM | POA: Diagnosis not present

## 2020-12-21 DIAGNOSIS — H35372 Puckering of macula, left eye: Secondary | ICD-10-CM | POA: Diagnosis not present

## 2020-12-21 DIAGNOSIS — H40013 Open angle with borderline findings, low risk, bilateral: Secondary | ICD-10-CM | POA: Diagnosis not present

## 2020-12-21 DIAGNOSIS — B029 Zoster without complications: Secondary | ICD-10-CM | POA: Diagnosis not present

## 2020-12-21 DIAGNOSIS — B0239 Other herpes zoster eye disease: Secondary | ICD-10-CM | POA: Diagnosis not present

## 2020-12-25 DIAGNOSIS — B0239 Other herpes zoster eye disease: Secondary | ICD-10-CM | POA: Diagnosis not present

## 2020-12-28 DIAGNOSIS — B0239 Other herpes zoster eye disease: Secondary | ICD-10-CM | POA: Diagnosis not present

## 2021-01-01 DIAGNOSIS — B0239 Other herpes zoster eye disease: Secondary | ICD-10-CM | POA: Diagnosis not present

## 2021-01-28 DIAGNOSIS — R946 Abnormal results of thyroid function studies: Secondary | ICD-10-CM | POA: Diagnosis not present

## 2021-01-28 DIAGNOSIS — K760 Fatty (change of) liver, not elsewhere classified: Secondary | ICD-10-CM | POA: Diagnosis not present

## 2021-01-28 DIAGNOSIS — E7800 Pure hypercholesterolemia, unspecified: Secondary | ICD-10-CM | POA: Diagnosis not present

## 2021-03-19 DIAGNOSIS — R946 Abnormal results of thyroid function studies: Secondary | ICD-10-CM | POA: Diagnosis not present

## 2021-04-01 ENCOUNTER — Other Ambulatory Visit: Payer: Self-pay | Admitting: Family Medicine

## 2021-04-01 DIAGNOSIS — Z1231 Encounter for screening mammogram for malignant neoplasm of breast: Secondary | ICD-10-CM

## 2021-05-20 DIAGNOSIS — U071 COVID-19: Secondary | ICD-10-CM | POA: Diagnosis not present

## 2021-05-20 DIAGNOSIS — R509 Fever, unspecified: Secondary | ICD-10-CM | POA: Diagnosis not present

## 2021-05-24 ENCOUNTER — Ambulatory Visit: Payer: Medicare Other

## 2021-06-06 ENCOUNTER — Ambulatory Visit
Admission: RE | Admit: 2021-06-06 | Discharge: 2021-06-06 | Disposition: A | Discharge status: Home / Self Care | Payer: Medicare Other | Source: Ambulatory Visit | Attending: Family Medicine | Admitting: Family Medicine

## 2021-06-06 ENCOUNTER — Other Ambulatory Visit: Payer: Self-pay

## 2021-06-06 DIAGNOSIS — Z1231 Encounter for screening mammogram for malignant neoplasm of breast: Secondary | ICD-10-CM | POA: Diagnosis not present

## 2021-06-21 DIAGNOSIS — Z23 Encounter for immunization: Secondary | ICD-10-CM | POA: Diagnosis not present

## 2021-07-15 DIAGNOSIS — M10072 Idiopathic gout, left ankle and foot: Secondary | ICD-10-CM | POA: Diagnosis not present

## 2021-09-26 DIAGNOSIS — H04123 Dry eye syndrome of bilateral lacrimal glands: Secondary | ICD-10-CM | POA: Diagnosis not present

## 2021-09-26 DIAGNOSIS — H35372 Puckering of macula, left eye: Secondary | ICD-10-CM | POA: Diagnosis not present

## 2021-09-26 DIAGNOSIS — H26493 Other secondary cataract, bilateral: Secondary | ICD-10-CM | POA: Diagnosis not present

## 2021-09-26 DIAGNOSIS — H40013 Open angle with borderline findings, low risk, bilateral: Secondary | ICD-10-CM | POA: Diagnosis not present

## 2021-10-24 DIAGNOSIS — H26492 Other secondary cataract, left eye: Secondary | ICD-10-CM | POA: Diagnosis not present

## 2021-12-18 DIAGNOSIS — F17210 Nicotine dependence, cigarettes, uncomplicated: Secondary | ICD-10-CM | POA: Diagnosis not present

## 2021-12-18 DIAGNOSIS — Z0000 Encounter for general adult medical examination without abnormal findings: Secondary | ICD-10-CM | POA: Diagnosis not present

## 2021-12-18 DIAGNOSIS — M810 Age-related osteoporosis without current pathological fracture: Secondary | ICD-10-CM | POA: Diagnosis not present

## 2021-12-18 DIAGNOSIS — R7303 Prediabetes: Secondary | ICD-10-CM | POA: Diagnosis not present

## 2021-12-18 DIAGNOSIS — E039 Hypothyroidism, unspecified: Secondary | ICD-10-CM | POA: Diagnosis not present

## 2021-12-18 DIAGNOSIS — I700 Atherosclerosis of aorta: Secondary | ICD-10-CM | POA: Diagnosis not present

## 2021-12-18 DIAGNOSIS — Z1389 Encounter for screening for other disorder: Secondary | ICD-10-CM | POA: Diagnosis not present

## 2022-01-02 ENCOUNTER — Other Ambulatory Visit: Payer: Self-pay

## 2022-01-02 DIAGNOSIS — Z122 Encounter for screening for malignant neoplasm of respiratory organs: Secondary | ICD-10-CM

## 2022-01-02 DIAGNOSIS — Z87891 Personal history of nicotine dependence: Secondary | ICD-10-CM

## 2022-01-02 DIAGNOSIS — F17210 Nicotine dependence, cigarettes, uncomplicated: Secondary | ICD-10-CM

## 2022-01-14 ENCOUNTER — Encounter: Payer: Self-pay | Admitting: Acute Care

## 2022-01-14 ENCOUNTER — Ambulatory Visit (INDEPENDENT_AMBULATORY_CARE_PROVIDER_SITE_OTHER): Payer: Medicare Other | Admitting: Acute Care

## 2022-01-14 DIAGNOSIS — F17210 Nicotine dependence, cigarettes, uncomplicated: Secondary | ICD-10-CM | POA: Diagnosis not present

## 2022-01-14 NOTE — Progress Notes (Signed)
Virtual Visit via Telephone Note ? ?I connected with Cynthia Travis on 01/14/22 at 10:30 AM EDT by telephone and verified that I am speaking with the correct person using two identifiers. ? ?Location: ?Patient:  At home ?Provider: [place], [place], [place], [address]  ?  ?I discussed the limitations, risks, security and privacy concerns of performing an evaluation and management service by telephone and the availability of in person appointments. I also discussed with the patient that there may be a patient responsible charge related to this service. The patient expressed understanding and agreed to proceed. ? ? ? ?Shared Decision Making Visit Lung Cancer Screening Program ?([phone]) ? ? ?Eligibility: ?Age 76 y.o. ?Pack Years Smoking History Calculation 38 pack year smoking history ?(# packs/per year x # years smoked) ?Recent History of coughing up blood  no ?Unexplained weight loss? no ?( >Than 15 pounds within the last 6 months ) ?Prior History Lung / other cancer no ?(Diagnosis within the last 5 years already requiring surveillance chest CT Scans). ?Smoking Status Current Smoker ?Former Smokers: Years since quit:  NA ? Quit Date:  NA ? ?Visit Components: ?Discussion included one or more decision making aids. yes ?Discussion included risk/benefits of screening. yes ?Discussion included potential follow up diagnostic testing for abnormal scans. yes ?Discussion included meaning and risk of over diagnosis. yes ?Discussion included meaning and risk of False Positives. yes ?Discussion included meaning of total radiation exposure. yes ? ?Counseling Included: ?Importance of adherence to annual lung cancer LDCT screening. yes ?Impact of comorbidities on ability to participate in the program. yes ?Ability and willingness to under diagnostic treatment. yes ? ?Smoking Cessation Counseling: ?Current Smokers:  ?Discussed importance of smoking cessation. yes ?Information about tobacco cessation classes and  interventions provided to patient. yes ?Patient provided with "ticket" for LDCT Scan. yes ?Symptomatic Patient. no ? Counseling NA ?Diagnosis Code: Tobacco Use Z72.0 ?Asymptomatic Patient yes ? Counseling (Intermediate counseling: > three minutes counseling) K9381 ?Former Smokers:  ?Discussed the importance of maintaining cigarette abstinence. yes ?Diagnosis Code: Personal History of Nicotine Dependence. W29.937 ?Information about tobacco cessation classes and interventions provided to patient. Yes ?Patient provided with "ticket" for LDCT Scan. yes ?Written Order for Lung Cancer Screening with LDCT placed in Epic. Yes ?(CT Chest Lung Cancer Screening Low Dose W/O CM) [id number] ?Z12.2-Screening of respiratory organs ?Z87.891-Personal history of nicotine dependence ? ?I have spent 25 minutes of face to face/ virtual visit   time with  Cynthia Travis discussing the risks and benefits of lung cancer screening. We viewed / discussed a power point together that explained in detail the above noted topics. We paused at intervals to allow for questions to be asked and answered to ensure understanding.We discussed that the single most powerful action that she can take to decrease her risk of developing lung cancer is to quit smoking. We discussed whether or not she is ready to commit to setting a quit date. We discussed options for tools to aid in quitting smoking including nicotine replacement therapy, non-nicotine medications, support groups, Quit Smart classes, and behavior modification. We discussed that often times setting smaller, more achievable goals, such as eliminating 1 cigarette a day for a week and then 2 cigarettes a day for a week can be helpful in slowly decreasing the number of cigarettes smoked. This allows for a sense of accomplishment as well as providing a clinical benefit. I provided  her  with smoking cessation  information  with contact information for community resources, classes,  free nicotine replacement  therapy, and access to mobile apps, text messaging, and on-line smoking cessation help. I have also provided  her  the office contact information in the event she needs to contact me, or the screening staff. We discussed the time and location of the scan, and that either Doroteo Glassman RN, Joella Prince, RN  or I will call / send a letter with the results within 24-72 hours of receiving them. The patient verbalized understanding of all of  the above and had no further questions upon leaving the office. They have my contact information in the event they have any further questions. ? ?I spent 3 minutes counseling on smoking cessation and the health risks of continued tobacco abuse. ? ?I explained to the patient that there has been a high incidence of coronary artery disease noted on these exams. I explained that this is a non-gated exam therefore degree or severity cannot be determined. This patient is  currently on statin therapy. I have asked the patient to follow-up with their PCP regarding any incidental finding of coronary artery disease and management with diet or medication as their PCP  feels is clinically indicated. The patient verbalized understanding of the above and had no further questions upon completion of the visit. ? ?  ? ? ?Magdalen Spatz, NP ?01/14/2022 ? ? ? ? ? ? ? ? ? ? ?

## 2022-01-14 NOTE — Patient Instructions (Signed)
Thank you for participating in the [facility]. It was our pleasure to meet you today. We will call you with the results of your scan within the next few days. Your scan will be assigned a Lung RADS category score by the physicians reading the scans.  This Lung RADS score determines follow up scanning.  See below for description of categories, and follow up screening recommendations. We will be in touch to schedule your follow up screening annually or based on recommendations of our providers. We will fax a copy of your scan results to your Primary Care Physician, or the physician who referred you to the program, to ensure they have the results. Please call the office if you have any questions or concerns regarding your scanning experience or results.  Our office number is [phone]. Please speak with Denise Phelps, RN. , or  Denise Buckner RN, They are  our Lung Cancer Screening RN.'s If They are unavailable when you call, Please leave a message on the voice mail. We will return your call at our earliest convenience.This voice mail is monitored several times a day.  Remember, if your scan is normal, we will scan you annually as long as you continue to meet the criteria for the program. (Age 55-77, Current smoker or smoker who has quit within the last 15 years). If you are a smoker, remember, quitting is the single most powerful action that you can take to decrease your risk of lung cancer and other pulmonary, breathing related problems. We know quitting is hard, and we are here to help.  Please let us know if there is anything we can do to help you meet your goal of quitting. If you are a former smoker, congratulations. We are proud of you! Remain smoke free! Remember you can refer friends or family members through the number above.  We will screen them to make sure they meet criteria for the program. Thank you for helping us take better care of you by  participating in Lung Screening.  You can receive free nicotine replacement therapy ( patches, gum or mints) by calling 1-800-QUIT NOW. Please call so we can get you on the path to becoming  a non-smoker. I know it is hard, but you can do this!  Lung RADS Categories:  Lung RADS 1: no nodules or definitely non-concerning nodules.  Recommendation is for a repeat annual scan in 12 months.  Lung RADS 2:  nodules that are non-concerning in appearance and behavior with a very low likelihood of becoming an active cancer. Recommendation is for a repeat annual scan in 12 months.  Lung RADS 3: nodules that are probably non-concerning , includes nodules with a low likelihood of becoming an active cancer.  Recommendation is for a 6-month repeat screening scan. Often noted after an upper respiratory illness. We will be in touch to make sure you have no questions, and to schedule your 6-month scan.  Lung RADS 4 A: nodules with concerning findings, recommendation is most often for a follow up scan in 3 months or additional testing based on our provider's assessment of the scan. We will be in touch to make sure you have no questions and to schedule the recommended 3 month follow up scan.  Lung RADS 4 B:  indicates findings that are concerning. We will be in touch with you to schedule additional diagnostic testing based on our provider's  assessment of the scan.  Other options for assistance in smoking cessation (   As covered by your insurance benefits)  Hypnosis for smoking cessation  [employer] [phone]  Acupuncture for smoking cessation  [facility] [phone]   

## 2022-01-15 ENCOUNTER — Ambulatory Visit (HOSPITAL_BASED_OUTPATIENT_CLINIC_OR_DEPARTMENT_OTHER)
Admission: RE | Admit: 2022-01-15 | Discharge: 2022-01-15 | Disposition: A | Discharge status: Home / Self Care | Payer: Medicare Other | Source: Ambulatory Visit | Attending: Family Medicine | Admitting: Family Medicine

## 2022-01-15 DIAGNOSIS — I700 Atherosclerosis of aorta: Secondary | ICD-10-CM | POA: Diagnosis not present

## 2022-01-15 DIAGNOSIS — F17210 Nicotine dependence, cigarettes, uncomplicated: Secondary | ICD-10-CM | POA: Insufficient documentation

## 2022-01-15 DIAGNOSIS — Z122 Encounter for screening for malignant neoplasm of respiratory organs: Secondary | ICD-10-CM | POA: Diagnosis not present

## 2022-01-15 DIAGNOSIS — Z87891 Personal history of nicotine dependence: Secondary | ICD-10-CM

## 2022-01-15 DIAGNOSIS — I2510 Atherosclerotic heart disease of native coronary artery without angina pectoris: Secondary | ICD-10-CM | POA: Diagnosis not present

## 2022-01-15 DIAGNOSIS — J439 Emphysema, unspecified: Secondary | ICD-10-CM | POA: Diagnosis not present

## 2022-01-16 ENCOUNTER — Other Ambulatory Visit: Payer: Self-pay

## 2022-01-16 DIAGNOSIS — F17210 Nicotine dependence, cigarettes, uncomplicated: Secondary | ICD-10-CM

## 2022-01-16 DIAGNOSIS — Z87891 Personal history of nicotine dependence: Secondary | ICD-10-CM

## 2022-01-16 DIAGNOSIS — Z122 Encounter for screening for malignant neoplasm of respiratory organs: Secondary | ICD-10-CM

## 2022-02-19 DIAGNOSIS — H40013 Open angle with borderline findings, low risk, bilateral: Secondary | ICD-10-CM | POA: Diagnosis not present

## 2022-02-19 DIAGNOSIS — H35372 Puckering of macula, left eye: Secondary | ICD-10-CM | POA: Diagnosis not present

## 2022-02-19 DIAGNOSIS — H26493 Other secondary cataract, bilateral: Secondary | ICD-10-CM | POA: Diagnosis not present

## 2022-02-19 DIAGNOSIS — H04123 Dry eye syndrome of bilateral lacrimal glands: Secondary | ICD-10-CM | POA: Diagnosis not present

## 2022-03-28 DIAGNOSIS — J0110 Acute frontal sinusitis, unspecified: Secondary | ICD-10-CM | POA: Diagnosis not present

## 2022-05-05 ENCOUNTER — Other Ambulatory Visit: Payer: Self-pay | Admitting: Family Medicine

## 2022-05-05 DIAGNOSIS — Z1231 Encounter for screening mammogram for malignant neoplasm of breast: Secondary | ICD-10-CM

## 2022-06-06 DIAGNOSIS — Z23 Encounter for immunization: Secondary | ICD-10-CM | POA: Diagnosis not present

## 2022-06-09 ENCOUNTER — Ambulatory Visit: Payer: Medicare Other

## 2022-06-19 DIAGNOSIS — R1011 Right upper quadrant pain: Secondary | ICD-10-CM | POA: Diagnosis not present

## 2022-06-19 DIAGNOSIS — R109 Unspecified abdominal pain: Secondary | ICD-10-CM | POA: Diagnosis not present

## 2022-06-19 DIAGNOSIS — R1012 Left upper quadrant pain: Secondary | ICD-10-CM | POA: Diagnosis not present

## 2022-06-19 DIAGNOSIS — K219 Gastro-esophageal reflux disease without esophagitis: Secondary | ICD-10-CM | POA: Diagnosis not present

## 2022-06-19 DIAGNOSIS — K760 Fatty (change of) liver, not elsewhere classified: Secondary | ICD-10-CM | POA: Diagnosis not present

## 2022-06-19 DIAGNOSIS — Z800 Family history of malignant neoplasm of digestive organs: Secondary | ICD-10-CM | POA: Diagnosis not present

## 2022-06-19 DIAGNOSIS — Z9049 Acquired absence of other specified parts of digestive tract: Secondary | ICD-10-CM | POA: Diagnosis not present

## 2022-06-20 DIAGNOSIS — Z1211 Encounter for screening for malignant neoplasm of colon: Secondary | ICD-10-CM | POA: Diagnosis not present

## 2022-07-09 ENCOUNTER — Ambulatory Visit: Payer: Medicare Other

## 2022-08-06 ENCOUNTER — Ambulatory Visit
Admission: RE | Admit: 2022-08-06 | Discharge: 2022-08-06 | Disposition: A | Discharge status: Home / Self Care | Payer: Medicare Other | Source: Ambulatory Visit | Attending: Family Medicine | Admitting: Family Medicine

## 2022-08-06 DIAGNOSIS — Z1231 Encounter for screening mammogram for malignant neoplasm of breast: Secondary | ICD-10-CM

## 2022-08-20 DIAGNOSIS — H04123 Dry eye syndrome of bilateral lacrimal glands: Secondary | ICD-10-CM | POA: Diagnosis not present

## 2022-08-20 DIAGNOSIS — H26493 Other secondary cataract, bilateral: Secondary | ICD-10-CM | POA: Diagnosis not present

## 2022-08-20 DIAGNOSIS — H40013 Open angle with borderline findings, low risk, bilateral: Secondary | ICD-10-CM | POA: Diagnosis not present

## 2022-08-20 DIAGNOSIS — H35372 Puckering of macula, left eye: Secondary | ICD-10-CM | POA: Diagnosis not present

## 2022-11-12 ENCOUNTER — Other Ambulatory Visit: Payer: Self-pay | Admitting: Acute Care

## 2022-11-12 DIAGNOSIS — Z122 Encounter for screening for malignant neoplasm of respiratory organs: Secondary | ICD-10-CM

## 2022-11-12 DIAGNOSIS — F17210 Nicotine dependence, cigarettes, uncomplicated: Secondary | ICD-10-CM

## 2022-11-12 DIAGNOSIS — Z87891 Personal history of nicotine dependence: Secondary | ICD-10-CM

## 2022-11-27 DIAGNOSIS — M25551 Pain in right hip: Secondary | ICD-10-CM | POA: Diagnosis not present

## 2023-01-16 DIAGNOSIS — I700 Atherosclerosis of aorta: Secondary | ICD-10-CM | POA: Diagnosis not present

## 2023-01-16 DIAGNOSIS — R946 Abnormal results of thyroid function studies: Secondary | ICD-10-CM | POA: Diagnosis not present

## 2023-01-16 DIAGNOSIS — F17210 Nicotine dependence, cigarettes, uncomplicated: Secondary | ICD-10-CM | POA: Diagnosis not present

## 2023-01-16 DIAGNOSIS — R7303 Prediabetes: Secondary | ICD-10-CM | POA: Diagnosis not present

## 2023-01-16 DIAGNOSIS — E039 Hypothyroidism, unspecified: Secondary | ICD-10-CM | POA: Diagnosis not present

## 2023-01-16 DIAGNOSIS — K219 Gastro-esophageal reflux disease without esophagitis: Secondary | ICD-10-CM | POA: Diagnosis not present

## 2023-01-16 DIAGNOSIS — M810 Age-related osteoporosis without current pathological fracture: Secondary | ICD-10-CM | POA: Diagnosis not present

## 2023-01-16 DIAGNOSIS — Z0000 Encounter for general adult medical examination without abnormal findings: Secondary | ICD-10-CM | POA: Diagnosis not present

## 2023-01-16 DIAGNOSIS — Z87898 Personal history of other specified conditions: Secondary | ICD-10-CM | POA: Diagnosis not present

## 2023-01-16 DIAGNOSIS — J439 Emphysema, unspecified: Secondary | ICD-10-CM | POA: Diagnosis not present

## 2023-01-16 DIAGNOSIS — Z23 Encounter for immunization: Secondary | ICD-10-CM | POA: Diagnosis not present

## 2023-01-16 DIAGNOSIS — Z1331 Encounter for screening for depression: Secondary | ICD-10-CM | POA: Diagnosis not present

## 2023-01-16 DIAGNOSIS — L409 Psoriasis, unspecified: Secondary | ICD-10-CM | POA: Diagnosis not present

## 2023-01-19 ENCOUNTER — Ambulatory Visit (HOSPITAL_BASED_OUTPATIENT_CLINIC_OR_DEPARTMENT_OTHER)
Admission: RE | Admit: 2023-01-19 | Discharge: 2023-01-19 | Disposition: A | Discharge status: Home / Self Care | Payer: Medicare Other | Source: Ambulatory Visit | Attending: Family Medicine | Admitting: Family Medicine

## 2023-01-19 DIAGNOSIS — F17210 Nicotine dependence, cigarettes, uncomplicated: Secondary | ICD-10-CM | POA: Diagnosis not present

## 2023-01-19 DIAGNOSIS — Z122 Encounter for screening for malignant neoplasm of respiratory organs: Secondary | ICD-10-CM | POA: Diagnosis not present

## 2023-01-19 DIAGNOSIS — Z87891 Personal history of nicotine dependence: Secondary | ICD-10-CM

## 2023-01-19 DIAGNOSIS — I700 Atherosclerosis of aorta: Secondary | ICD-10-CM | POA: Insufficient documentation

## 2023-01-19 DIAGNOSIS — J439 Emphysema, unspecified: Secondary | ICD-10-CM | POA: Diagnosis not present

## 2023-01-20 ENCOUNTER — Other Ambulatory Visit: Payer: Self-pay | Admitting: Family Medicine

## 2023-01-20 DIAGNOSIS — M810 Age-related osteoporosis without current pathological fracture: Secondary | ICD-10-CM

## 2023-01-22 ENCOUNTER — Other Ambulatory Visit: Payer: Self-pay | Admitting: Acute Care

## 2023-01-22 DIAGNOSIS — Z122 Encounter for screening for malignant neoplasm of respiratory organs: Secondary | ICD-10-CM

## 2023-01-22 DIAGNOSIS — Z87891 Personal history of nicotine dependence: Secondary | ICD-10-CM

## 2023-01-22 DIAGNOSIS — F17210 Nicotine dependence, cigarettes, uncomplicated: Secondary | ICD-10-CM

## 2023-03-12 DIAGNOSIS — H40013 Open angle with borderline findings, low risk, bilateral: Secondary | ICD-10-CM | POA: Diagnosis not present

## 2023-03-12 DIAGNOSIS — H26493 Other secondary cataract, bilateral: Secondary | ICD-10-CM | POA: Diagnosis not present

## 2023-03-12 DIAGNOSIS — H35372 Puckering of macula, left eye: Secondary | ICD-10-CM | POA: Diagnosis not present

## 2023-03-12 DIAGNOSIS — H04123 Dry eye syndrome of bilateral lacrimal glands: Secondary | ICD-10-CM | POA: Diagnosis not present

## 2023-06-06 DIAGNOSIS — Z23 Encounter for immunization: Secondary | ICD-10-CM | POA: Diagnosis not present

## 2023-07-07 ENCOUNTER — Other Ambulatory Visit: Payer: Self-pay | Admitting: Family Medicine

## 2023-07-07 DIAGNOSIS — Z1231 Encounter for screening mammogram for malignant neoplasm of breast: Secondary | ICD-10-CM

## 2023-07-13 DIAGNOSIS — Z1211 Encounter for screening for malignant neoplasm of colon: Secondary | ICD-10-CM | POA: Diagnosis not present

## 2023-07-22 ENCOUNTER — Other Ambulatory Visit: Payer: Self-pay | Admitting: Gastroenterology

## 2023-07-22 DIAGNOSIS — Z800 Family history of malignant neoplasm of digestive organs: Secondary | ICD-10-CM

## 2023-07-22 DIAGNOSIS — R195 Other fecal abnormalities: Secondary | ICD-10-CM

## 2023-07-22 DIAGNOSIS — K760 Fatty (change of) liver, not elsewhere classified: Secondary | ICD-10-CM | POA: Diagnosis not present

## 2023-07-22 DIAGNOSIS — K219 Gastro-esophageal reflux disease without esophagitis: Secondary | ICD-10-CM | POA: Diagnosis not present

## 2023-07-22 DIAGNOSIS — Z9049 Acquired absence of other specified parts of digestive tract: Secondary | ICD-10-CM | POA: Diagnosis not present

## 2023-08-17 ENCOUNTER — Encounter: Payer: Self-pay | Admitting: Radiology

## 2023-08-17 ENCOUNTER — Ambulatory Visit
Admission: RE | Admit: 2023-08-17 | Discharge: 2023-08-17 | Disposition: A | Discharge status: Home / Self Care | Payer: Medicare Other | Source: Ambulatory Visit | Attending: Family Medicine | Admitting: Family Medicine

## 2023-08-17 DIAGNOSIS — Z1231 Encounter for screening mammogram for malignant neoplasm of breast: Secondary | ICD-10-CM

## 2023-08-17 HISTORY — DX: Personal history of irradiation: Z92.3

## 2023-08-24 ENCOUNTER — Ambulatory Visit
Admission: RE | Admit: 2023-08-24 | Discharge: 2023-08-24 | Disposition: A | Discharge status: Home / Self Care | Payer: Medicare Other | Source: Ambulatory Visit | Attending: Family Medicine | Admitting: Family Medicine

## 2023-08-24 DIAGNOSIS — M810 Age-related osteoporosis without current pathological fracture: Secondary | ICD-10-CM

## 2023-08-24 DIAGNOSIS — N958 Other specified menopausal and perimenopausal disorders: Secondary | ICD-10-CM | POA: Diagnosis not present

## 2023-08-24 DIAGNOSIS — E2839 Other primary ovarian failure: Secondary | ICD-10-CM | POA: Diagnosis not present

## 2023-08-24 DIAGNOSIS — Z90722 Acquired absence of ovaries, bilateral: Secondary | ICD-10-CM | POA: Diagnosis not present

## 2023-08-24 DIAGNOSIS — M8588 Other specified disorders of bone density and structure, other site: Secondary | ICD-10-CM | POA: Diagnosis not present

## 2023-09-02 ENCOUNTER — Ambulatory Visit
Admission: RE | Admit: 2023-09-02 | Discharge: 2023-09-02 | Disposition: A | Discharge status: Home / Self Care | Payer: Medicare Other | Source: Ambulatory Visit | Attending: Gastroenterology | Admitting: Gastroenterology

## 2023-09-02 DIAGNOSIS — R195 Other fecal abnormalities: Secondary | ICD-10-CM

## 2023-09-02 DIAGNOSIS — Z538 Procedure and treatment not carried out for other reasons: Secondary | ICD-10-CM | POA: Diagnosis not present

## 2023-09-02 DIAGNOSIS — K5730 Diverticulosis of large intestine without perforation or abscess without bleeding: Secondary | ICD-10-CM | POA: Diagnosis not present

## 2023-09-02 DIAGNOSIS — Z800 Family history of malignant neoplasm of digestive organs: Secondary | ICD-10-CM | POA: Diagnosis not present

## 2023-09-11 DIAGNOSIS — H26493 Other secondary cataract, bilateral: Secondary | ICD-10-CM | POA: Diagnosis not present

## 2023-09-11 DIAGNOSIS — H04123 Dry eye syndrome of bilateral lacrimal glands: Secondary | ICD-10-CM | POA: Diagnosis not present

## 2023-09-11 DIAGNOSIS — H35372 Puckering of macula, left eye: Secondary | ICD-10-CM | POA: Diagnosis not present

## 2023-09-11 DIAGNOSIS — H40013 Open angle with borderline findings, low risk, bilateral: Secondary | ICD-10-CM | POA: Diagnosis not present

## 2023-09-11 DIAGNOSIS — H524 Presbyopia: Secondary | ICD-10-CM | POA: Diagnosis not present

## 2023-11-28 DIAGNOSIS — H6992 Unspecified Eustachian tube disorder, left ear: Secondary | ICD-10-CM | POA: Diagnosis not present

## 2023-11-28 DIAGNOSIS — R110 Nausea: Secondary | ICD-10-CM | POA: Diagnosis not present

## 2023-11-28 DIAGNOSIS — J329 Chronic sinusitis, unspecified: Secondary | ICD-10-CM | POA: Diagnosis not present

## 2023-11-28 DIAGNOSIS — H8112 Benign paroxysmal vertigo, left ear: Secondary | ICD-10-CM | POA: Diagnosis not present

## 2024-01-20 ENCOUNTER — Ambulatory Visit (HOSPITAL_BASED_OUTPATIENT_CLINIC_OR_DEPARTMENT_OTHER)
Admission: RE | Admit: 2024-01-20 | Discharge: 2024-01-20 | Disposition: A | Discharge status: Home / Self Care | Payer: Medicare Other | Source: Ambulatory Visit | Attending: Acute Care | Admitting: Acute Care

## 2024-01-20 DIAGNOSIS — Z87891 Personal history of nicotine dependence: Secondary | ICD-10-CM | POA: Diagnosis not present

## 2024-01-20 DIAGNOSIS — Z122 Encounter for screening for malignant neoplasm of respiratory organs: Secondary | ICD-10-CM | POA: Insufficient documentation

## 2024-01-20 DIAGNOSIS — F17210 Nicotine dependence, cigarettes, uncomplicated: Secondary | ICD-10-CM | POA: Diagnosis not present

## 2024-02-12 DIAGNOSIS — Z0000 Encounter for general adult medical examination without abnormal findings: Secondary | ICD-10-CM | POA: Diagnosis not present

## 2024-02-12 DIAGNOSIS — B0229 Other postherpetic nervous system involvement: Secondary | ICD-10-CM | POA: Diagnosis not present

## 2024-02-12 DIAGNOSIS — I700 Atherosclerosis of aorta: Secondary | ICD-10-CM | POA: Diagnosis not present

## 2024-02-12 DIAGNOSIS — K219 Gastro-esophageal reflux disease without esophagitis: Secondary | ICD-10-CM | POA: Diagnosis not present

## 2024-02-12 DIAGNOSIS — M810 Age-related osteoporosis without current pathological fracture: Secondary | ICD-10-CM | POA: Diagnosis not present

## 2024-02-12 DIAGNOSIS — L409 Psoriasis, unspecified: Secondary | ICD-10-CM | POA: Diagnosis not present

## 2024-02-12 DIAGNOSIS — Z1331 Encounter for screening for depression: Secondary | ICD-10-CM | POA: Diagnosis not present

## 2024-02-12 DIAGNOSIS — J439 Emphysema, unspecified: Secondary | ICD-10-CM | POA: Diagnosis not present

## 2024-02-12 DIAGNOSIS — Z87898 Personal history of other specified conditions: Secondary | ICD-10-CM | POA: Diagnosis not present

## 2024-02-12 DIAGNOSIS — R7303 Prediabetes: Secondary | ICD-10-CM | POA: Diagnosis not present

## 2024-02-12 DIAGNOSIS — E039 Hypothyroidism, unspecified: Secondary | ICD-10-CM | POA: Diagnosis not present

## 2024-02-12 DIAGNOSIS — F17210 Nicotine dependence, cigarettes, uncomplicated: Secondary | ICD-10-CM | POA: Diagnosis not present

## 2024-02-16 ENCOUNTER — Other Ambulatory Visit: Payer: Self-pay

## 2024-02-16 DIAGNOSIS — F17210 Nicotine dependence, cigarettes, uncomplicated: Secondary | ICD-10-CM

## 2024-02-16 DIAGNOSIS — Z87891 Personal history of nicotine dependence: Secondary | ICD-10-CM

## 2024-02-16 DIAGNOSIS — Z122 Encounter for screening for malignant neoplasm of respiratory organs: Secondary | ICD-10-CM

## 2024-05-31 DIAGNOSIS — Z23 Encounter for immunization: Secondary | ICD-10-CM | POA: Diagnosis not present

## 2024-07-05 DIAGNOSIS — R222 Localized swelling, mass and lump, trunk: Secondary | ICD-10-CM | POA: Diagnosis not present

## 2024-07-05 DIAGNOSIS — J0101 Acute recurrent maxillary sinusitis: Secondary | ICD-10-CM | POA: Diagnosis not present

## 2024-07-05 DIAGNOSIS — N6312 Unspecified lump in the right breast, upper inner quadrant: Secondary | ICD-10-CM | POA: Diagnosis not present

## 2024-07-07 ENCOUNTER — Other Ambulatory Visit: Payer: Self-pay | Admitting: Family Medicine

## 2024-07-07 DIAGNOSIS — N6312 Unspecified lump in the right breast, upper inner quadrant: Secondary | ICD-10-CM

## 2024-07-13 ENCOUNTER — Ambulatory Visit
Admission: RE | Admit: 2024-07-13 | Discharge: 2024-07-13 | Disposition: A | Source: Ambulatory Visit | Attending: Family Medicine | Admitting: Family Medicine

## 2024-07-13 ENCOUNTER — Other Ambulatory Visit: Payer: Self-pay | Admitting: Family Medicine

## 2024-07-13 DIAGNOSIS — N6312 Unspecified lump in the right breast, upper inner quadrant: Secondary | ICD-10-CM

## 2024-07-13 DIAGNOSIS — N6489 Other specified disorders of breast: Secondary | ICD-10-CM | POA: Diagnosis not present

## 2024-07-13 DIAGNOSIS — R928 Other abnormal and inconclusive findings on diagnostic imaging of breast: Secondary | ICD-10-CM | POA: Diagnosis not present

## 2024-07-14 ENCOUNTER — Other Ambulatory Visit: Payer: Self-pay | Admitting: Family Medicine

## 2024-07-14 DIAGNOSIS — N6310 Unspecified lump in the right breast, unspecified quadrant: Secondary | ICD-10-CM
